# Patient Record
Sex: Female | Born: 1945 | Race: White | Hispanic: No | Marital: Single | Smoking: Former smoker
Health system: Southern US, Community
[De-identification: ages and names within clinical notes are randomized; demographics above are authoritative.]

## PROBLEM LIST (undated history)

## (undated) DIAGNOSIS — M7030 Other bursitis of elbow, unspecified elbow: Secondary | ICD-10-CM

## (undated) DIAGNOSIS — K929 Disease of digestive system, unspecified: Secondary | ICD-10-CM

## (undated) DIAGNOSIS — J449 Chronic obstructive pulmonary disease, unspecified: Secondary | ICD-10-CM

## (undated) DIAGNOSIS — G7000 Myasthenia gravis without (acute) exacerbation: Secondary | ICD-10-CM

## (undated) DIAGNOSIS — I10 Essential (primary) hypertension: Secondary | ICD-10-CM

## (undated) DIAGNOSIS — J479 Bronchiectasis, uncomplicated: Secondary | ICD-10-CM

## (undated) DIAGNOSIS — C099 Malignant neoplasm of tonsil, unspecified: Secondary | ICD-10-CM

## (undated) DIAGNOSIS — E039 Hypothyroidism, unspecified: Secondary | ICD-10-CM

## (undated) HISTORY — PX: FOOT SURGERY: SHX648

---
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
## 2005-08-13 HISTORY — PX: RECTOCELE REPAIR: SHX761

## 2006-02-11 HISTORY — PX: ABDOMINAL HYSTERECTOMY: SHX81

## 2007-08-28 HISTORY — PX: GASTROSTOMY TUBE PLACEMENT: SHX655

## 2007-09-14 DIAGNOSIS — G7000 Myasthenia gravis without (acute) exacerbation: Secondary | ICD-10-CM

## 2007-09-14 HISTORY — DX: Myasthenia gravis without (acute) exacerbation: G70.00

## 2013-03-28 ENCOUNTER — Inpatient Hospital Stay: Payer: Self-pay | Admitting: Internal Medicine

## 2013-03-28 LAB — COMPREHENSIVE METABOLIC PANEL
BUN: 32 mg/dL — ABNORMAL HIGH (ref 7–18)
Calcium, Total: 10.2 mg/dL — ABNORMAL HIGH (ref 8.5–10.1)
EGFR (African American): 50 — ABNORMAL LOW
Osmolality: 272 (ref 275–301)
Potassium: 3.7 mmol/L (ref 3.5–5.1)
SGOT(AST): 69 U/L — ABNORMAL HIGH (ref 15–37)
SGPT (ALT): 68 U/L (ref 12–78)
Sodium: 132 mmol/L — ABNORMAL LOW (ref 136–145)
Total Protein: 8.3 g/dL — ABNORMAL HIGH (ref 6.4–8.2)

## 2013-03-28 LAB — LIPASE, BLOOD: Lipase: 140 U/L (ref 73–393)

## 2013-03-28 LAB — CBC
HCT: 47.5 % — ABNORMAL HIGH (ref 35.0–47.0)
MCHC: 34.7 g/dL (ref 32.0–36.0)
MCV: 96 fL (ref 80–100)
Platelet: 218 10*3/uL (ref 150–440)

## 2013-03-28 LAB — URINALYSIS, COMPLETE
Bacteria: NONE SEEN
Glucose,UR: NEGATIVE mg/dL (ref 0–75)
Nitrite: NEGATIVE
RBC,UR: NONE SEEN /HPF (ref 0–5)
Specific Gravity: 1.011 (ref 1.003–1.030)

## 2013-03-29 LAB — COMPREHENSIVE METABOLIC PANEL
Albumin: 3.2 g/dL — ABNORMAL LOW (ref 3.4–5.0)
Anion Gap: 7 (ref 7–16)
BUN: 24 mg/dL — ABNORMAL HIGH (ref 7–18)
Bilirubin,Total: 0.6 mg/dL (ref 0.2–1.0)
Calcium, Total: 8.6 mg/dL (ref 8.5–10.1)
Chloride: 104 mmol/L (ref 98–107)
Co2: 25 mmol/L (ref 21–32)
Creatinine: 0.9 mg/dL (ref 0.60–1.30)
EGFR (African American): >60
Potassium: 3.2 mmol/L — ABNORMAL LOW (ref 3.5–5.1)
SGOT(AST): 32 U/L (ref 15–37)
Sodium: 136 mmol/L (ref 136–145)

## 2013-03-29 LAB — CBC WITH DIFFERENTIAL/PLATELET
Basophil #: 0 10*3/uL (ref 0.0–0.1)
Eosinophil #: 0 10*3/uL (ref 0.0–0.7)
Eosinophil %: 0 %
HCT: 38.3 % (ref 35.0–47.0)
Lymphocyte #: 0.8 10*3/uL — ABNORMAL LOW (ref 1.0–3.6)
Lymphocyte %: 11.6 %
MCH: 33.1 pg (ref 26.0–34.0)
MCV: 95 fL (ref 80–100)
Monocyte %: 9.1 %
Neutrophil #: 5.4 10*3/uL (ref 1.4–6.5)
Neutrophil %: 78.8 %
Platelet: 198 10*3/uL (ref 150–440)
RDW: 13.1 % (ref 11.5–14.5)

## 2013-03-29 LAB — TSH: Thyroid Stimulating Horm: 1.85 u[IU]/mL

## 2013-03-30 LAB — CBC WITH DIFFERENTIAL/PLATELET
Basophil #: 0 10*3/uL (ref 0.0–0.1)
Basophil %: 0.2 %
Eosinophil %: 0 %
HGB: 12.9 g/dL (ref 12.0–16.0)
Lymphocyte #: 0.5 10*3/uL — ABNORMAL LOW (ref 1.0–3.6)
MCHC: 34 g/dL (ref 32.0–36.0)
MCV: 97 fL (ref 80–100)
Neutrophil #: 6.4 10*3/uL (ref 1.4–6.5)
Neutrophil %: 88.8 %
Platelet: 193 10*3/uL (ref 150–440)
RBC: 3.9 10*6/uL (ref 3.80–5.20)
WBC: 7.2 10*3/uL (ref 3.6–11.0)

## 2013-03-30 LAB — BASIC METABOLIC PANEL
Anion Gap: 7 (ref 7–16)
Calcium, Total: 8.6 mg/dL (ref 8.5–10.1)
Chloride: 111 mmol/L — ABNORMAL HIGH (ref 98–107)
Co2: 25 mmol/L (ref 21–32)
Creatinine: 0.78 mg/dL (ref 0.60–1.30)
EGFR (Non-African Amer.): >60
Glucose: 145 mg/dL — ABNORMAL HIGH (ref 65–99)
Osmolality: 289 (ref 275–301)
Potassium: 3.1 mmol/L — ABNORMAL LOW (ref 3.5–5.1)
Sodium: 143 mmol/L (ref 136–145)

## 2013-03-31 LAB — CBC WITH DIFFERENTIAL/PLATELET
Basophil #: 0 10*3/uL (ref 0.0–0.1)
Basophil %: 0.6 %
HCT: 36.5 % (ref 35.0–47.0)
HGB: 12.6 g/dL (ref 12.0–16.0)
Lymphocyte %: 10.7 %
MCH: 33.2 pg (ref 26.0–34.0)
MCHC: 34.5 g/dL (ref 32.0–36.0)
Monocyte #: 0.5 x10 3/mm (ref 0.2–0.9)
Monocyte %: 7.7 %
Platelet: 171 10*3/uL (ref 150–440)
RBC: 3.79 10*6/uL — ABNORMAL LOW (ref 3.80–5.20)
WBC: 6.1 10*3/uL (ref 3.6–11.0)

## 2013-03-31 LAB — BASIC METABOLIC PANEL
Co2: 29 mmol/L (ref 21–32)
Creatinine: 0.67 mg/dL (ref 0.60–1.30)
EGFR (African American): >60
EGFR (Non-African Amer.): >60
Glucose: 121 mg/dL — ABNORMAL HIGH (ref 65–99)
Osmolality: 288 (ref 275–301)
Sodium: 143 mmol/L (ref 136–145)

## 2013-04-01 LAB — COMPREHENSIVE METABOLIC PANEL
Albumin: 3 g/dL — ABNORMAL LOW (ref 3.4–5.0)
Anion Gap: 4 — ABNORMAL LOW (ref 7–16)
BUN: 24 mg/dL — ABNORMAL HIGH (ref 7–18)
Bilirubin,Total: 0.6 mg/dL (ref 0.2–1.0)
Chloride: 109 mmol/L — ABNORMAL HIGH (ref 98–107)
EGFR (African American): >60
Glucose: 100 mg/dL — ABNORMAL HIGH (ref 65–99)
Osmolality: 285 (ref 275–301)
Potassium: 4.1 mmol/L (ref 3.5–5.1)
SGOT(AST): 24 U/L (ref 15–37)
Sodium: 141 mmol/L (ref 136–145)
Total Protein: 5.7 g/dL — ABNORMAL LOW (ref 6.4–8.2)

## 2013-04-01 LAB — PHOSPHORUS: Phosphorus: 2.2 mg/dL — ABNORMAL LOW (ref 2.5–4.9)

## 2013-04-01 LAB — MAGNESIUM: Magnesium: 1.8 mg/dL

## 2013-04-02 LAB — BASIC METABOLIC PANEL
Anion Gap: 1 — ABNORMAL LOW (ref 7–16)
Calcium, Total: 8.3 mg/dL — ABNORMAL LOW (ref 8.5–10.1)
Chloride: 107 mmol/L (ref 98–107)
Co2: 30 mmol/L (ref 21–32)
Creatinine: 0.76 mg/dL (ref 0.60–1.30)
EGFR (African American): >60
EGFR (Non-African Amer.): >60
Glucose: 115 mg/dL — ABNORMAL HIGH (ref 65–99)
Osmolality: 281 (ref 275–301)
Potassium: 3.2 mmol/L — ABNORMAL LOW (ref 3.5–5.1)
Sodium: 138 mmol/L (ref 136–145)

## 2013-04-02 LAB — PATHOLOGY REPORT

## 2015-01-03 NOTE — Discharge Summary (Signed)
PATIENT NAME:  Maria Vincent, Maria Vincent MR#:  [id number] DATE OF BIRTH:  [birth year]  DATE OF ADMISSION:  03/28/2013 DATE OF DISCHARGE:  04/03/2013  ADMITTING PHYSICIAN: Dr. Laurin Coder.  DISCHARGING PHYSICIAN: Gladstone Lighter, M.D.   PRIMARY PHYSICIAN:  At [place]: Dr. Candace Cruise.  CONSULTATIONS IN THE HOSPITAL:  Gastrointestinal consultation with Dr. Verdie Shire.  DISCHARGE DIAGNOSES: 1.  Nausea and vomiting secondary to constipation in the setting of myasthenia gravis.  2.  Constipation.  3.  Myasthenia gravis.  4.  Hypokalemia  5.  Malignant hypertension.   DISCHARGE MEDICATIONS: 1.  Cozaar 25 mg per G-tube twice a day.  2.  Spironolactone 12.5 mg per G-tube once a day at 7:00 a.m.  3.  Mestinon 60 mg per 5 mL syrup - 5 mL per G-tube 4 times a day.  5.  Synthroid 75 mcg per G-tube once a day.  6.  Fluoride topical paste applied to the denture trays twice a day after brushing teeth.  7.  Calcium carbonate 800 mg per G-tube once a day.  8.  Vitamin D3 500 international units per G-tube once a day.  9.  Diphenhydramine 25 mg per G-tube once a day at bedtime.  10.  Benefiber 10 mL per G-tube 3 times a day.  11.  Echinacea- 1tablet- each 400 mg per G-tube twice a day.  12.  Probiotic oral capsule 1 capsule per G-tube twice a day.  13.  Reglan 5 mg per 5 mL - 5 mL 3 times a day before meals.  14.  MiraLAX powder 17 grams daily for constipation. Stopped taking it if having more than 2 to 3 bowel movements per day.  15.  Norvasc 5 mg p.o. once a day at 7:00 p.m.  16.  Zofran 4 mg per 5 mL solution - 5 mL every 6 hours as needed for nausea or vomiting.   DISCHARGE DIET: Regular.  The patient is on Isosource tube bolus feeds 3 times a day.   DISCHARGE ACTIVITY: As tolerated.   FOLLOWUP INSTRUCTIONS: 1.  PCP follow-up in 1 to 2 weeks.  2.  Gastrointestinal follow-up with Dr. Candace Cruise in 2 to 3 weeks.  3.  Resume home tube feeds.   LABS AND IMAGING STUDIES PRIOR TO  DISCHARGE:   1.  Upper GI endoscopy performed on 03/30/13 showing feeding tube intact without ulceration or irritation.  Normal esophagus, normal duodenum and ulcers stomach.   2.  Sodium 138, potassium 3.2, chloride 107, bicarbonate 30, BUN 24, creatinine 0.76, glucose 115 and calcium of 8.3.  3.  Abdominal x-ray showing unremarkable abdominal radiograph.  4.  WBC 6.1, hemoglobin 12.6, hematocrit 36.5, platelet count 171.  5.  CT of the head without contrast for intractable headaches showing no acute intracranial process. MRCP done: Calcifications in the pancreas or in the splenic artery. No pancreatic ductal dilatation. Mild distention present. No biliary distention. Gallbladder is nondistended. Biopsies from esophagogastroduodenoscopy showing chronic gastritis, negative for H. pylori. Abdominal ultrasound done on admission showing tiny stones in proximal pancreatic duct with mild dilatation of the pancreatic duct. No gallstones or biliary distention.   BRIEF HOSPITAL COURSE: [name] is a 69 year old Caucasian female with past medical history significant for myasthenia gravis resulting in significant dysphagia that she has a PEG tube, was brought in from home secondary to intractable nausea and vomiting and unable to tolerate tube feeds. She had an abdominal ultrasound done here in the hospital, which revealed pancreatic duct stones and  so was admitted.  1.  Intractable nausea and vomiting. The patient had intractable nausea and vomiting for the first three days into hospital course. She was seen by GI consultants for the same. She had an MRCP done which said they were not stones, but more of calcifications on the pancreas. Because of her myasthenia gravis, she was already on Mestinon. She was started on Reglan here in the hospital and started on a good bowel regimen with fiber and also MiraLAX powder which relieved her constipation and since then her nausea and vomiting, improved. She was started on  trickle continuous tube feeds and then changed over to bolus feeds at the same rate that she takes at home and she tolerated them well prior to discharge. She was advised to follow up with Dr. Candace Cruise as an outpatient for possible EUS for pancreatic calcifications.  2.  Hypertension. Blood pressure has been elevated mostly in the evenings while here in the hospital. She is already taking Cozaar and spironolactone at home and Norvasc has been added to be given in the evenings through the G-tube.  THE PATIENT IS ALLERGIC TO CLONIDINE PATCH, as she had a severe reaction during prior hospitalization and severe constipation related to that and that was advised to be included in her allergies.   3.  Myasthenia gravis. Severe dysphagia, nothing by mouth. She is on Mestinon. She pools secretions in her mouth, which could also cause her nausea so I advised to suction as needed.  4.  Her course has been otherwise uneventful in the hospital. The patient has ambulated well with physical therapy and will be discharged home without any further needs.  She was also followed by the dietitian while in the hospital.   DISCHARGE CONDITION: Stable.   DISCHARGE DISPOSITION: Home.   TIME SPENT ON DISCHARGE: 45 minutes.  ____________________________ Gladstone Lighter, MD rk:dp D: 04/03/2013 14:51:52 ET T: 04/03/2013 16:34:37 ET JOB#: [id number]  cc: Gladstone Lighter, MD, <Dictator> Lupita Dawn. Candace Cruise, MD Gladstone Lighter MD ELECTRONICALLY SIGNED 04/16/2013 15:20

## 2015-01-03 NOTE — H&P (Signed)
PATIENT NAME:  Maria Vincent, Maria Vincent MR#:  [id number] DATE OF BIRTH:  12/19/1945  DATE OF ADMISSION:  03/28/2013  REASON FOR ADMISSION: Intractable nausea and vomiting, headache, abdominal distention.   REFERRING PHYSICIAN: Sheryl L. Benjaman Lobe, MD  PRIMARY CARE PHYSICIAN: At [facility].   HISTORY OF PRESENT ILLNESS: This is a very nice 69 year old female diagnosed with myasthenia gravis in 2009, comes with a history of nausea and vomiting intractable, dehydration, feeling really weak. Not tolerating feedings through gastrostomy tube with frequent nausea and vomiting, for what she is admitted for evaluation of this. She has a primary care physician, Dr. Burns Spain, in [place], [place], who is her neurologist. She goes also to [facility]. The patient states that she was doing okay up until yesterday. At around 1:00 p.m. she started having some headache over the left eye, nausea and vomiting. She has vomited about 20 times. She is having a lot of dry heaving. The patient had her last bowel movement this a.m. and had very hard, very small lump of stool. She says that yesterday she was having very small hard stools as well. She has not had any significant exacerbations of her MS, since last in April where she had a plasma exchange. She states she has been getting really hot and cold with chills, and she also has some difficulty mobilizing secretions due to her bronchiectasis. The patient is evaluated in the ER. She is very dehydrated. She got several boluses of IV fluids. Her blood pressure has been elevated at 234/139, but now is starting to come down with Vasotec. The patient was tachycardic at 98, afebrile with temperature of 97.9. The patient at this moment is doing a little bit better, but still feels that she cannot eat. She feels very, very nauseous.   REVIEW OF SYSTEMS: CONSTITUTIONAL: The patient denies any fever, but she feels hot and cold occasionally within the last 24 hours. She is very  fatigued. She is weak. She denies any significant weight loss or weight gain. HEENT: Eyes: No blurry vision, double vision. No epistaxis or nasal discharge.  RESPIRATORY: No cough, wheezing, hemoptysis. The patient has COPD and bronchiectasis  and has always a lot of secretions and at this moment, they do not seem any different.  CARDIOVASCULAR: No chest pain, orthopnea, syncope or palpitations.  GASTROINTESTINAL: Positive nausea. Positive vomiting. No diarrhea. Positive mild abdominal pain due to the dry heaving and vomiting, just muscle pain. No hematemesis. No jaundice. No rectal bleeding.  GENITOURINARY: No dysuria, hematuria, changes in frequency.  GYNECOLOGIC: No breast masses.  ENDOCRINE: No polyuria, polydipsia, polyphagia, cold or heat intolerance.  HEMATOLOGIC AND LYMPHATIC: No anemia, easy bruising or swollen glands.  SKIN: No rashes, petechiae or new moles.  MUSCULOSKELETAL: No significant back pain, shoulder pain, neck pain or gout. The patient has myasthenia gravis and she is weak all over, and is regular basis, but not incapacitated.  NEUROLOGIC: Myasthenia gravis. Positive headache. Right now, the headache is 5 to 7 out of 10 on the left eye. She states it is similar to prior headaches that she had before whenever she has been dehydrated.  PSYCHIATRIC: No significant depression. No significant insomnia.   PAST MEDICAL HISTORY: 1.  Myasthenia gravis. Her neurologist for myasthenia gravis is Dr. Irven Easterly. [place].  2.  Hypertension.  3.  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  4.  Bronchiectasis.  5.  Dysphagia.  6.  Chronic aspiration.  7.  COPD.  8.  Mild pulmonary fibrosis.  9.  Hypertension, followed by Dr. Burns Spain, primary care physician at [facility].  10.  Hypothyroidism.  PAST SURGICAL HISTORY: 1.  G-tube placement on 08/28/2007 with replacement on 03/02/2013.  2.  Rectocele repair.  3.  Foot surgery due to bone spurs.  4.   Hysterectomy in 1997.  5.  Appendectomy in 1962.   SOCIAL HISTORY: She used to smoke for 19 years 1 pack a day. She quit in 1987. She does not drink. She lives by herself. Recently moved back from [place] to be close to her family. She is a retired Optometrist.   FAMILY HISTORY: Positive for MI in her mom, dad, grandparents. CVA in her grandfather  mother. Breast cancer in aunt.   ALLERGIES: THE PATIENT IS ALLERGIC TO PENICILLIN, NOVOCAIN, , NEOSPORIN, POLYSPORIN, BACITRACIN, CEPHALOSPORINS, BIAXIN, AMBIEN, DEMEROL, PHENERGAN, ALBUTEROL. SHE IS ALSO ALLERGIC TO JUNIPER AND INSECT STINGS.  CURRENT MEDICATIONS: Include:  1.  Cozaar 25 mg twice daily. 2.  Spironolactone 12.5 mg in the morning. 3.  Mestinon 1 teaspoon at 7:00 a.m., 10:00 a.m., 1:00 p.m., 4:00 p.m. 4.  Synthroid 75 mcg once daily. 5.  Isosorbide 1.5 boluses through the day.  6.  Bactroban as needed.  7.  Calcium, vitamin D once a day.   PHYSICAL EXAMINATION: VITAL SIGNS: Blood pressure 234/139, pulse of 98, respirations 18, temperature 97.7, oxygen saturation of 97% on room air.  GENERAL: The patient is alert, oriented x 3. Looks severely debilitated, dehydrated. No use of accessory muscles.  HEENT: Pupils are equal and reactive. Extraocular movements are intact. Mucosae are dry. Anicteric sclerae. Pink conjunctivae. No oral lesions. Normocephalic, atraumatic. No thrush.  NECK: Supple. No JVD. No thyromegaly. No adenopathy. No carotid bruits. Trachea central. No rigidity.  CARDIOVASCULAR: Regular rate and rhythm, tachycardic. No murmurs, rubs or gallops are appreciated. PMI is nondisplaced. No tenderness to palpation anterior chest wall.  LUNGS: Clear without any wheezing or crepitus. No use of accessory muscles. No rhonchi.  ABDOMEN: Soft, slightly distended, mildly tender to palpation at the level of the suprapubic area and left lower quadrant. No Murphy. No McBurney. No rebound. Abdomen is slightly distended and tympanic.   GENITAL: Negative for external lesions.  EXTREMITIES: No edema, cyanosis or clubbing. Pulses +2. Capillary refill less than 3.  LYMPHATIC: Negative for lymphadenopathy in neck or supraclavicular areas.  SKIN: No rashes or petechiae. Positive decreased turgor.  VASCULAR: Pulses +2. Capillary refill less than 3.  NEUROLOGIC: Cranial nerves II through XII intact. No focal findings. Strength is equal in 4 extremities. The patient moves all 4 extremities. Her strength is actually 5/5.  PSYCHIATRIC: No significant alteration of judgment. The patient is alert, oriented x 3. No significant depression.  MUSCULOSKELETAL: No significant joint effusions or joint erythema.   LABORATORY AND RADIOLOGICAL DATA: Glucose 104, BUN 32; potassium is 3.7; serum sodium is 132, GFR around 43; total calcium is 10.2, AST 69, alkaline phosphatase 128, bilirubin 0.9. Hemoglobin 16.5; white count is 9.4, platelet count 218. Urinalysis: No signs of urinary tract infection. Chest x-ray: No infiltrates. Ultrasound abdomen: As mentioned above, tiny stones appear to be present in proximal pancreatic duct with mild dilation of the pancreatic duct. No gallstones or biliary distention is noticed.   ASSESSMENT AND PLAN: A 69 year old female with history of myasthenia gravis comes with intractable nausea and vomiting.  1.  Intractable nausea and vomiting: This is likely secondary to severe constipation. The patient had a KUB. The KUB shows a  large amount of stool in the proximal colon and distal colon at the level of the sigmoid. The patient is likely to have severe constipation causing her to have signs of pseudo-obstruction. There are no significant fluid levels at the level of the intestine, but she is definitely constipated and she is very small. We are going to start from holding her tube feedings and giving her nausea medication. Treating her constipation first with an enema, then adding on MiraLAX. We are going to go from that and  see if that improves. Another cause of the nausea could be related to stones at the level of the pancreatic tube. I do not know how significant is this. We are going to add a gastroenterology consultation in the morning to see if there is anything else that needs to be done. The pancreatic stones could be related to dehydration, but it also could be that patient passed a stone from the gallbladder and that made her have significant nausea and vomiting. The patient does not have any significant elevation of lipase. Will monitor dehydration. If the patient continues to have significant nausea and vomiting, we are going to place a nasogastric tube.  2.  Accelerated hypertension: The patient has significant uncontrolled hypertension in general. She was on a clonidine patch and that did not touch her blood pressure today. Her blood pressure is more elevated. We are going to do hydralazine. We are going to do Vasotec. Monitor closely. Avoid beta blockers. Due to patient's myasthenia gravis, beta blockers could be contraindicated. 3.  Bronchiectasis: The patient does not seem to have any exacerbation, for what we are just going to monitor.  4.  Chronic obstructive pulmonary disease: Add on nebulizers as needed. 5.  Gastrointestinal prophylaxis with Protonix.  6.  Deep vein thrombosis prophylaxis with heparin.   TIME SPENT: I spent about 45 minutes with this patient.    ____________________________ [place] Sink, MD rsg:jm D: 03/28/2013 19:37:33 ET T: 03/28/2013 20:19:32 ET JOB#: [id number]  cc:  Sink, MD, <Dictator> Eldridge Marcott America Brown MD ELECTRONICALLY SIGNED 03/29/2013 20:29

## 2015-01-03 NOTE — Consult Note (Signed)
Pt seen and examined. Please see [facility] Sos notes. Acute onset of nausea and vomiting. Still nauseous after a good bowel movement. Possible pancreatic stone seen on CT. Normal LFT and lipase.  Recommend MRCP. IF MRCP clearly shows PD stone, recommend referring to [facility] or elsewhere for stone removal either now or later. If MRCP neg, then can proceed with EGD to r/o other causes if nausea persists. Thanks.   Electronic Signatures: Verdie Shire (MD) (Signed on 17-Jul-14 13:36)  Authored   Last Updated: 17-Jul-14 13:37 by Verdie Shire (MD)

## 2015-01-03 NOTE — Consult Note (Signed)
PATIENT NAME:  Maria Vincent, Maria Vincent MR#:  [id number] DATE OF BIRTH:  [birth year]  DATE OF CONSULTATION:  03/29/2013  REFERRING PHYSICIAN:  [place] Sink, MD CONSULTING PHYSICIAN:  Corky Sox. Zettie Pho, PA-C  DICTATING FOR: Lupita Dawn. Oh, MD  REASON FOR CONSULTATION: Nausea, vomiting and possible obstruction.   HISTORY OF PRESENT ILLNESS: This is a pleasant 69 year old female with a past medical history significant for myasthenia gravis and tonsillar cancer that has resulted in chronic aspiration, requiring her to get her feedings through a G-tube. PEG was placed several years ago, but was recently exchanged for a fresh tube by Dr. Baird Cancer at [facility] in June of last month. She states that over the past few days, she has had intractable nausea and vomiting, where each time she administers herself a feed, she vomits it right back up. Upon further workup, the patient did undergo a KUB that did demonstrate bowel gas pattern that was nonspecific; however, there was some extensive constipation noted. She also underwent an abdominal ultrasound that was questionable for a dilated pancreatic duct at 1.5 mm, with possible stones in the proximal duct. Of note, she had no gallstones or biliary distention. Her LFTs and her lipase are within normal limits. Because of the constipation findings, she was administered an enema yesterday evening, and a large bowel movement was able to be produced. She does admit that she felt somewhat better following this, but is still complaining of dry heaves and nausea. There has been no actual emesis; however, she also has not been eating, and she has been n.p.o. A urinalysis is negative, and white count is within normal limits as well.   ALLERGIES: PENICILLIN, NOVOCAIN, NEOSPORIN, POLYSPORIN, CEPHALOSPORINS, BACITRACIN, BIAXIN, AMBIEN, DEMEROL, PHENERGAN, ALBUTEROL, JUNIPER, BEE STINGS.   HOME MEDICATIONS: Calcium, Bactroban, isosorbide, Synthroid, Mestinon, spironolactone and Cozaar.    PAST MEDICAL HISTORY: Tonsillar cancer status post radiation, myasthenia gravis, hypertension, bronchiectasis, chronic aspiration, COPD, pulmonary fibrosis, hypertension and hypothyroidism.   PAST SURGICAL HISTORY: G-tube placement in 2008, this was replaced in 2014, appendectomy, hysterectomy, foot surgery and rectocele repair.   FAMILY HISTORY: No known family history of GI malignancy, colon polyps or IBD.   SOCIAL HISTORY: Remote history of tobacco use with a 19-pack-year history, but she quit over 20 years ago. She denies any current alcohol, tobacco or illicit drug use.   REVIEW OF SYSTEMS: A 10-system review of systems was obtained on the patient. Pertinent positives are mentioned above and otherwise negative.   OBJECTIVE:  VITAL SIGNS: Blood pressure 121/76, heart rate 99, respirations 20, temperature 98.4, bedside pulse oximetry 95%.  GENERAL: This is a 69 year old female resting quietly and comfortably in bed, in mild distress.  HEAD: Atraumatic, normocephalic.  NECK: Supple. No lymphadenopathy noted.  HEENT: Sclerae anicteric. Mucous membranes moist.  PULMONARY: Respirations are even and unlabored. Clear to auscultation of bilateral anterior lung fields.  CARDIAC: Regular rate and rhythm. S1, S2 noted.  ABDOMEN: Soft, nontender, nondistended. PEG tube noted in place. Normoactive bowel sounds noted in all 4 quadrants.  RECTAL: Deferred.  PSYCHIATRIC: Appropriate mood and affect.  EXTREMITIES: Negative for lower extremity edema, 2+ pulses noted bilaterally.   LABORATORY DATA: White blood cells 6.9, hemoglobin 13.3, hematocrit 38.3, platelets 198. Sodium 136, potassium 3.2, BUN 24, creatinine 0.90, glucose 118. Urinalysis is negative. Lipase 140. LFTs are within normal limits with a bilirubin of 0.6, AST 32, ALT 46, alkaline phosphatase 97, albumin slightly low at 3.2. TSH 1.85.   IMAGING: A KUB was obtained on the  patient showing bowel gas pattern that was nonspecific, with no  evidence of obstruction. Extensive constipation, however, was noted.   Chest x-ray was obtained on the patient showing hyperinflation consistent with COPD, but no focal pneumonia and no evidence of CHF or pleural effusion.   Ultrasound of the abdomen was obtained on the patient showing pancreatic duct dilated at 1.5 mm with possible stones in the proximal duct. No pancreatic mass was identified. Gallbladder wall thickness was normal. No evidence of gallstones within the gallbladder. Liver appeared unremarkable.   ASSESSMENT:  1. Intractable nausea and vomiting.  2. History of dysphagia, currently with a PEG tube in place by Dr. Baird Cancer at [facility].  3. Constipation noted on x-ray.  4. Abnormal ultrasound noting questionable dilation of her pancreatic duct with stones in the distal aspect of the duct.  5. History of myasthenia gravis.  6. Chronic obstructive pulmonary disease.   PLAN: I have discussed this patient's case in detail with Dr. Verdie Shire, who was involved in the development of the patient's plan of care. At this time, we did have a long discussion with the patient regarding potential etiologies of her intractable nausea and vomiting. It could certainly be related to the extensive constipation noted on x-ray. Therefore, we do agree with administration of enemas and continued bowel care with MiraLax and stool softener if necessary. In regard to the ultrasound, however, there was questionable pancreatic ductal dilation with stones within the duct, and therefore we do recommend proceeding with an MRCP to evaluate if this is truly a stone or obstruction or not. Of note, her LFTs and lipase are within normal limits. Should this be a stone, this could certainly be contributing to her nausea and vomiting as well. We do recommend continuing symptomatic management with fluids and antiemetics as needed, and we will make further recommendations pending the MRCP, bowel care and per clinical course. This was  discussed with the patient, and she verbalized understanding, and all questions were answered.   Thank you so much for this consultation and for allowing [place] to participate in the patient's plan of care.   ATTENDING GASTROENTEROLOGIST: Lupita Dawn. Candace Cruise, MD  ____________________________ Corky Sox. Raseel Jans, PA-C kme:OSi D: 03/29/2013 13:06:44 ET T: 03/29/2013 13:29:53 ET JOB#: [id number]  cc: Corky Sox. Shamyah Stantz, PA-C, <Dictator> [place] PA ELECTRONICALLY SIGNED 04/02/2013 13:41

## 2015-01-03 NOTE — Consult Note (Signed)
Chief Complaint:  Subjective/Chief Complaint Events of the weekend noted. Feeling much better. No nausea. Having BM's. So far tolerating TF's.   VITAL SIGNS/ANCILLARY NOTES: **Vital Signs.:   21-Jul-14 08:00  Vital Signs Type Q 4hr  Temperature Temperature (F) 98.9  Celsius 37.1  Temperature Source oral  Pulse Pulse 89  Respirations Respirations 18  Systolic BP Systolic BP 219  Diastolic BP (mmHg) Diastolic BP (mmHg) 93  Mean BP 109  Pulse Ox % Pulse Ox % 95  Pulse Ox Activity Level  At rest  Oxygen Delivery Room Air/ 21 %   Brief Assessment:  GEN no acute distress   Cardiac Regular   Respiratory clear BS   Gastrointestinal Normal   Lab Results: Routine Chem:  21-Jul-14 05:53   Glucose, Serum  115  BUN  24  Creatinine (comp) 0.76  Sodium, Serum 138  Potassium, Serum  3.2  Chloride, Serum 107  CO2, Serum 30  Calcium (Total), Serum  8.3  Anion Gap  1  Osmolality (calc) 281  eGFR (African American) >60  eGFR (Non-African American) >60 (eGFR values <78m/min/1.73 m2 may be an indication of chronic kidney disease (CKD). Calculated eGFR is useful in patients with stable renal function. The eGFR calculation will not be reliable in acutely ill patients when serum creatinine is changing rapidly. It is not useful in  patients on dialysis. The eGFR calculation may not be applicable to patients at the low and high extremes of body sizes, pregnant women, and vegetarians.)   Assessment/Plan:  Assessment/Plan:  Assessment Nausea. Better.   Plan Continue reglan if helping via G tube. Miralax daily for regular BM's. Discussed possibility of EUS later to check for chronic pancreatits if recurrent symptoms. Will sign off. Pt can f/u with [place] discharge. Thanks.   Electronic Signatures: OVerdie Shire(MD)  (Signed 21-Jul-14 10:54)  Authored: Chief Complaint, VITAL SIGNS/ANCILLARY NOTES, Brief Assessment, Lab Results, Assessment/Plan   Last Updated: 21-Jul-14 10:54 by OVerdie Shire(MD)

## 2015-01-03 NOTE — Consult Note (Signed)
EGD done. Feeding tube intact. No explanation for nausea. Gastric bx taken for H.pylori. Would proceed with CT or MRI to evaluate pancreas further. Dr. Gustavo Lah will see patient over the weekend. Thanks.  Electronic Signatures: Verdie Shire (MD)  (Signed on 18-Jul-14 08:43)  Authored  Last Updated: 18-Jul-14 08:43 by Verdie Shire (MD)

## 2015-01-03 NOTE — Consult Note (Signed)
Chief Complaint:  Subjective/Chief Complaint MRI put on hold until radiology obtains info from [facility] regarding possible metal content in PEG tube. Pt with headache. Still feels nauseous though less so. No BM yest.   VITAL SIGNS/ANCILLARY NOTES: **Vital Signs.:   18-Jul-14 05:03  Vital Signs Type Recheck  Systolic BP Systolic BP 520  Diastolic BP (mmHg) Diastolic BP (mmHg) 82  Mean BP 106  BP Source  if not from Vital Sign Device manual   Brief Assessment:  GEN no acute distress   Cardiac Regular   Respiratory clear BS   Gastrointestinal Button in stomach. O/W normal.   Lab Results:  Routine Chem:  18-Jul-14 05:36   Glucose, Serum  145  BUN 18  Creatinine (comp) 0.78  Sodium, Serum 143  Potassium, Serum  3.1  Chloride, Serum  111  CO2, Serum 25  Calcium (Total), Serum 8.6  Anion Gap 7  Osmolality (calc) 289  eGFR (African American) >60  eGFR (Non-African American) >60 (eGFR values <42m/min/1.73 m2 may be an indication of chronic kidney disease (CKD). Calculated eGFR is useful in patients with stable renal function. The eGFR calculation will not be reliable in acutely ill patients when serum creatinine is changing rapidly. It is not useful in  patients on dialysis. The eGFR calculation may not be applicable to patients at the low and high extremes of body sizes, pregnant women, and vegetarians.)  Routine Hem:  18-Jul-14 05:36   WBC (CBC) 7.2  RBC (CBC) 3.90  Hemoglobin (CBC) 12.9  Hematocrit (CBC) 37.9  Platelet Count (CBC) 193  MCV 97  MCH 33.0  MCHC 34.0  RDW 13.3  Neutrophil % 88.8  Lymphocyte % 6.6  Monocyte % 4.4  Eosinophil % 0.0  Basophil % 0.2  Neutrophil # 6.4  Lymphocyte #  0.5  Monocyte # 0.3  Eosinophil # 0.0  Basophil # 0.0 (Result(s) reported on 30 Mar 2013 at 06:40AM.)   Radiology Results: [place]    16-Jul-14 16:35, [place]dom[place] Survey  [place] Survey   REASON FOR EXAM:    RUQ pain  COMMENTS:   Body Site: GB and Fossa,  CBD, Head of Pancreas    PROCEDURE: [place] - [place]DOM[place] LIMITED SURVEY  - Mar 28 2013  4:35PM     RESULT: History: Pain.    Comparison Study: No prior.    Findings: Liver normal. Portal vein patent. Gallbladder normal. Negative   Murphy's sign. Gallbladder wall thickness 3.8 mm. Common bile duct   caliber 1.5 mm.    Pancreatic duct is dilated at 1.5 mm with possible stones in the proximal   duct. No pancreatic mass is identified.  IMPRESSION:  Tiny stones appear to be present in the proximal pancreatic   duct with mild dilatation of the a pancreatic duct. No gallstones or   biliary distention noted.        Verified By: TOsa Craver M.D., MD   Assessment/Plan:  Assessment/Plan:  Assessment Persistent nausea. Poss PD stone.   Plan While we await MR, will proceed with EGD this morning to check for any ulcer disease that can account for nausea. If MRCP cannot be done, then order CT of abd with contrast to evaluate pancreatic duct.Thanks.   Electronic Signatures: OVerdie Shire(MD)  (Signed 18-Jul-14 07:17)  Authored: Chief Complaint, VITAL SIGNS/ANCILLARY NOTES, Brief Assessment, Lab Results, Radiology Results, Assessment/Plan   Last Updated: 18-Jul-14 07:17 by OVerdie Shire(MD)

## 2015-01-03 NOTE — Consult Note (Signed)
Chief Complaint:  Subjective/Chief Complaint patient seen for n/v.  currently no n/v, discussed with Dr Tressia Miners this am, will trial low volume TF.   VITAL SIGNS/ANCILLARY NOTES: **Vital Signs.:   19-Jul-14 14:36  Vital Signs Type Routine  Temperature Temperature (F) 98.6  Celsius 37  Pulse Pulse 112  Respirations Respirations 18  Systolic BP Systolic BP 431  Diastolic BP (mmHg) Diastolic BP (mmHg) 540  Mean BP 134  Pulse Ox % Pulse Ox % 98  Pulse Ox Activity Level  At rest  Oxygen Delivery Room Air/ 21 %  *Intake and Output.:   19-Jul-14 14:30  Tube Feeding Residual (ml) ml 0    17:00  Tube Feeding Residual (ml) ml 0   Brief Assessment:  Cardiac Regular   Respiratory clear BS   Gastrointestinal details normal Soft  Nontender  Nondistended  No masses palpable  Bowel sounds normal   Lab Results:  Routine Chem:  19-Jul-14 06:04   Result Comment POTASSIUM - RESULTS VERIFIED BY REPEAT TESTING.  - NOTIFIED OF CRITICAL VALUE  - C/SYLVIA FUENTES AT 0867 03/31/13-DAC  - READ-BACK PROCESS PERFORMED.  Result(s) reported on 31 Mar 2013 at 06:35AM.  Potassium, Serum  2.4  Glucose, Serum  121  BUN 18  Creatinine (comp) 0.67  Sodium, Serum 143  Chloride, Serum  108  CO2, Serum 29  Calcium (Total), Serum 8.5  Anion Gap  6  Osmolality (calc) 288  eGFR (African American) >60  eGFR (Non-African American) >60 (eGFR values <46m/min/1.73 m2 may be an indication of chronic kidney disease (CKD). Calculated eGFR is useful in patients with stable renal function. The eGFR calculation will not be reliable in acutely ill patients when serum creatinine is changing rapidly. It is not useful in  patients on dialysis. The eGFR calculation may not be applicable to patients at the low and high extremes of body sizes, pregnant women, and vegetarians.)    16:07   Result Comment POTASSIUM - RESULTS VERIFIED BY REPEAT TESTING.  - NOTIFIED OF CRITICAL VALUE  - C/COBY LEIPER AT 1632  03/31/13-LAB  - READ-BACK PROCESS PERFORMED.  Result(s) reported on 31 Mar 2013 at 04:33PM.  Potassium, Serum  2.5  Routine Hem:  19-Jul-14 06:04   WBC (CBC) 6.1  RBC (CBC)  3.79  Hemoglobin (CBC) 12.6  Hematocrit (CBC) 36.5  Platelet Count (CBC) 171  MCV 96  MCH 33.2  MCHC 34.5  RDW 13.1  Neutrophil % 81.0  Lymphocyte % 10.7  Monocyte % 7.7  Eosinophil % 0.0  Basophil % 0.6  Neutrophil # 4.9  Lymphocyte #  0.6  Monocyte # 0.5  Eosinophil # 0.0  Basophil # 0.0 (Result(s) reported on 31 Mar 2013 at 0[facility])   Radiology Results: MRI:    18-Jul-14 14:05, MRCP MR Cholangiogram  MRCP MR Cholangiogram   REASON FOR EXAM:    Possible pancreatic duct stone on Ultrasound  COMMENTS:       PROCEDURE: MR  - MR CHOLANGIOGRAM  - Mar 30 2013  2:05PM     RESULT: History: Pancreatic duct stone. Comparison Study: Ultrasound   abdomen 03/28/2013.    Technique:M.R.C.P. obtained. Axial and reconstructed images reviewed.    Findings: Gallbladder is nondistended. No biliary ductal distention   noted. Calcification in the pancreas could represent splenic artery   calcification or ductal stones. Mild pancreatic ductal distention noted.   If further evaluation of the pancreatic duct is needed, standard ERCP   will be needed as the calcifications in the region of  the a pancreas     could be in the splenic artery or the pancreatic duct .    IMPRESSION:  Cannot be determined if the calcifications in the pancreas   are in the splenic artery or pancreatic duct. No prominent pancreatic   ductal distention is noted. Mild distention is present. No biliary   distention. Gallbladder is nondistended.        Verified TI:RWERXV ERodrigo Ran, M.D., MD  CT:    18-Jul-14 14:12, CT Head Without Contrast  CT Head Without Contrast   REASON FOR EXAM:    intractable heache with nausea  COMMENTS:       PROCEDURE: CT  - CT HEAD WITHOUT CONTRAST  - Mar 30 2013  2:12PM     RESULT: Comparison:   None    Technique: Multiple axial images from the foramen magnum to the vertex   were obtained without IV contrast.    Findings:      There is no evidence of mass effect, midline shift, or extra-axial fluid   collections.  There is no evidence of a space-occupying lesion or   intracranial hemorrhage. There is no evidence of a cortical-based area of     acute infarction.      The ventricles and sulci are appropriate for the patient's age. The basal   cisterns are patent.    Visualized portions of the orbits are unremarkable. There is a small left   maxillary sinus air-fluid level.     Theosseous structures are unremarkable.    IMPRESSION:      No acute intracranial process.    Left maxillary sinusitis.  Dictation Site: 1        Verified By: Jennette Banker, M.D., MD   Assessment/Plan:  Assessment/Plan:  Assessment 1) nausea, felt to be possible related to constipation/gastric stasis in this patient with myasthenia gravis.  MRCP yesterday not confirmatory for pancreatic duct stones, note normal lipase and no dilation of the P-duct on MRCP.  Recommend daily rather than prn miralax, will order 2 way abd film for tomorrow am. 2) electrolyte abnormalities noted, woudl correct to improve GI motility.   Plan as above, following.   Electronic Signatures: Loistine Simas (MD)  (Signed 19-Jul-14 20:01)  Authored: Chief Complaint, VITAL SIGNS/ANCILLARY NOTES, Brief Assessment, Lab Results, Radiology Results, Assessment/Plan   Last Updated: 19-Jul-14 20:01 by Loistine Simas (MD)

## 2015-01-03 NOTE — Consult Note (Signed)
Brief Consult Note: Diagnosis: nausea, vomiting.   Patient was seen by consultant.   Consult note dictated.   Orders entered.   Discussed with Attending MD.   Comments: Patient seen and evaluated. Intractable nausea and vomiting. KUB normal bowel gas pattern but extensive constipation noted. She was given an enema last night which produced a large quantity BM. She felt a little better but still uncomfortable. No further emesis but she has been NPO. She receives nutrition via G-tube due to history of aspiration. Tube recently replaced in June by Dr Baird [facility] at [facility] Patient also had an [place] noting a possible pancreatici duct stone. LFTs and Lipase are WNL. Recommend MRCP to evaluate the biliary tree and see if this is truly a PD stone. Also agree with bowel care to relieve her constipation. N/V may certainly be a result of either of those. CTM LFTs. antiemetics PRN. Full consult dictated. Will follow.  Electronic Signatures: Sherald Barge (PA-C)  (Signed 17-Jul-14 12:43)  Authored: Brief Consult Note   Last Updated: 17-Jul-14 12:43 by Sherald Barge (PA-C)

## 2015-01-03 NOTE — Consult Note (Signed)
Chief Complaint:  Subjective/Chief Complaint seen for nausea, emesis.  doing well, TF restarted at low rate, tolerating well.  denies n/v or abdominal pain.  had a bm today.   VITAL SIGNS/ANCILLARY NOTES: **Vital Signs.:   20-Jul-14 05:21  Vital Signs Type Recheck  Pulse Pulse 91  Systolic BP Systolic BP 193  Diastolic BP (mmHg) Diastolic BP (mmHg) 86  Mean BP 101    14:33  Vital Signs Type Q 4hr  Temperature Temperature (F) 97.9  Celsius 36.6  Temperature Source oral  Pulse Pulse 106  Respirations Respirations 18  Systolic BP Systolic BP 790  Diastolic BP (mmHg) Diastolic BP (mmHg) 95  Mean BP 123  Pulse Ox % Pulse Ox % 96  Pulse Ox Activity Level  At rest  Oxygen Delivery Room Air/ 21 %  *Intake and Output.:   20-Jul-14 10:45  Stool  pt. had diahrea    15:35  Stool  pt. had a small amount of diahrea   Brief Assessment:  Cardiac Regular   Respiratory clear BS   Gastrointestinal details normal Soft  Nontender  Nondistended  No masses palpable  Bowel sounds normal   Lab Results: Hepatic:  20-Jul-14 06:16   Bilirubin, Total 0.6  Alkaline Phosphatase 87  SGPT (ALT) 33  SGOT (AST) 24  Total Protein, Serum  5.7  Albumin, Serum  3.0  Routine Chem:  20-Jul-14 06:16   Magnesium, Serum 1.8 (1.8-2.4 THERAPEUTIC RANGE: 4-7 mg/dL TOXIC: > 10 mg/dL  -----------------------)  Phosphorus, Serum  2.2 (Result(s) reported on 01 Apr 2013 at 06:57AM.)  Glucose, Serum  100  BUN  24  Creatinine (comp) 0.76  Sodium, Serum 141  Potassium, Serum 4.1  Chloride, Serum  109  CO2, Serum 28  Calcium (Total), Serum 8.6  Osmolality (calc) 285  eGFR (African American) >60  eGFR (Non-African American) >60 (eGFR values <40m/min/1.73 m2 may be an indication of chronic kidney disease (CKD). Calculated eGFR is useful in patients with stable renal function. The eGFR calculation will not be reliable in acutely ill patients when serum creatinine is changing rapidly. It is not useful in   patients on dialysis. The eGFR calculation may not be applicable to patients at the low and high extremes of body sizes, pregnant women, and vegetarians.)  Anion Gap  4   Radiology Results: XRay:    20-Jul-14 09:07, Abdomen Flat and Erect  Abdomen Flat and Erect   REASON FOR EXAM:    nausea vomiting, constipation  COMMENTS:       PROCEDURE: DXR - DXR ABDOMEN 2 V FLAT AND ERECT  - Apr 01 2013  9:07AM     RESULT: Comparisons:  03/28/2013    Findings:      Supine and upright views of the abdomen are provided.    There is a nonspecific bowel gas pattern. There is no bowel dilatation to   suggest obstruction. There are no air-fluid levels. There is no   pathologic calcification along the expected course of the ureters. There   is no evidence of pneumoperitoneum,portal venous gas, or pneumatosis.  The osseous structures are unremarkable.    IMPRESSION:     Unremarkable abdominal radiograph.    Dictation Site: 1        Verified By: HJennette Banker M.D., MD   Assessment/Plan:  Assessment/Plan:  Assessment 1) nausea, vomiting in the settign of probable gastroparesis and constipation, history of myasthenia gravis.  improved symptomatically.   Plan 1) change iv reglan to 5 mg SYRUP per  ngt q 8 hours, may decrease miralax to prn. Dr Candace Cruise to be back tomorrow.   Electronic Signatures: Loistine Simas (MD)  (Signed 20-Jul-14 18:29)  Authored: Chief Complaint, VITAL SIGNS/ANCILLARY NOTES, Brief Assessment, Lab Results, Radiology Results, Assessment/Plan   Last Updated: 20-Jul-14 18:29 by Loistine Simas (MD)

## 2015-10-10 ENCOUNTER — Encounter (HOSPITAL_BASED_OUTPATIENT_CLINIC_OR_DEPARTMENT_OTHER): Payer: Self-pay | Admitting: *Deleted

## 2015-10-10 ENCOUNTER — Inpatient Hospital Stay (HOSPITAL_BASED_OUTPATIENT_CLINIC_OR_DEPARTMENT_OTHER)
Admission: EM | Admit: 2015-10-10 | Discharge: 2015-10-15 | DRG: 177 | Disposition: A | Discharge status: Home / Self Care | Payer: Medicare Other | Attending: Internal Medicine | Admitting: Internal Medicine

## 2015-10-10 ENCOUNTER — Emergency Department (HOSPITAL_BASED_OUTPATIENT_CLINIC_OR_DEPARTMENT_OTHER): Payer: Medicare Other

## 2015-10-10 DIAGNOSIS — E871 Hypo-osmolality and hyponatremia: Secondary | ICD-10-CM | POA: Diagnosis present

## 2015-10-10 DIAGNOSIS — R112 Nausea with vomiting, unspecified: Secondary | ICD-10-CM | POA: Diagnosis not present

## 2015-10-10 DIAGNOSIS — E034 Atrophy of thyroid (acquired): Secondary | ICD-10-CM

## 2015-10-10 DIAGNOSIS — Z9981 Dependence on supplemental oxygen: Secondary | ICD-10-CM

## 2015-10-10 DIAGNOSIS — Z884 Allergy status to anesthetic agent status: Secondary | ICD-10-CM

## 2015-10-10 DIAGNOSIS — Z885 Allergy status to narcotic agent status: Secondary | ICD-10-CM

## 2015-10-10 DIAGNOSIS — C099 Malignant neoplasm of tonsil, unspecified: Secondary | ICD-10-CM | POA: Diagnosis present

## 2015-10-10 DIAGNOSIS — R0902 Hypoxemia: Secondary | ICD-10-CM

## 2015-10-10 DIAGNOSIS — K219 Gastro-esophageal reflux disease without esophagitis: Secondary | ICD-10-CM | POA: Diagnosis present

## 2015-10-10 DIAGNOSIS — R6889 Other general symptoms and signs: Secondary | ICD-10-CM

## 2015-10-10 DIAGNOSIS — Z823 Family history of stroke: Secondary | ICD-10-CM

## 2015-10-10 DIAGNOSIS — Z6821 Body mass index (BMI) 21.0-21.9, adult: Secondary | ICD-10-CM

## 2015-10-10 DIAGNOSIS — G7000 Myasthenia gravis without (acute) exacerbation: Secondary | ICD-10-CM | POA: Diagnosis present

## 2015-10-10 DIAGNOSIS — E038 Other specified hypothyroidism: Secondary | ICD-10-CM

## 2015-10-10 DIAGNOSIS — Z8701 Personal history of pneumonia (recurrent): Secondary | ICD-10-CM

## 2015-10-10 DIAGNOSIS — I10 Essential (primary) hypertension: Secondary | ICD-10-CM | POA: Diagnosis present

## 2015-10-10 DIAGNOSIS — J9620 Acute and chronic respiratory failure, unspecified whether with hypoxia or hypercapnia: Secondary | ICD-10-CM | POA: Diagnosis present

## 2015-10-10 DIAGNOSIS — Z923 Personal history of irradiation: Secondary | ICD-10-CM

## 2015-10-10 DIAGNOSIS — Z87891 Personal history of nicotine dependence: Secondary | ICD-10-CM

## 2015-10-10 DIAGNOSIS — R040 Epistaxis: Secondary | ICD-10-CM | POA: Diagnosis not present

## 2015-10-10 DIAGNOSIS — E039 Hypothyroidism, unspecified: Secondary | ICD-10-CM | POA: Diagnosis present

## 2015-10-10 DIAGNOSIS — J471 Bronchiectasis with (acute) exacerbation: Secondary | ICD-10-CM | POA: Diagnosis present

## 2015-10-10 DIAGNOSIS — Z931 Gastrostomy status: Secondary | ICD-10-CM

## 2015-10-10 DIAGNOSIS — J690 Pneumonitis due to inhalation of food and vomit: Secondary | ICD-10-CM | POA: Diagnosis not present

## 2015-10-10 DIAGNOSIS — Z881 Allergy status to other antibiotic agents status: Secondary | ICD-10-CM

## 2015-10-10 DIAGNOSIS — Z888 Allergy status to other drugs, medicaments and biological substances status: Secondary | ICD-10-CM

## 2015-10-10 DIAGNOSIS — E876 Hypokalemia: Secondary | ICD-10-CM | POA: Diagnosis present

## 2015-10-10 DIAGNOSIS — Z882 Allergy status to sulfonamides status: Secondary | ICD-10-CM

## 2015-10-10 DIAGNOSIS — R490 Dysphonia: Secondary | ICD-10-CM | POA: Diagnosis present

## 2015-10-10 DIAGNOSIS — J9621 Acute and chronic respiratory failure with hypoxia: Secondary | ICD-10-CM | POA: Diagnosis present

## 2015-10-10 DIAGNOSIS — J849 Interstitial pulmonary disease, unspecified: Secondary | ICD-10-CM | POA: Diagnosis present

## 2015-10-10 DIAGNOSIS — Z8249 Family history of ischemic heart disease and other diseases of the circulatory system: Secondary | ICD-10-CM

## 2015-10-10 DIAGNOSIS — Z79899 Other long term (current) drug therapy: Secondary | ICD-10-CM

## 2015-10-10 DIAGNOSIS — E440 Moderate protein-calorie malnutrition: Secondary | ICD-10-CM | POA: Diagnosis present

## 2015-10-10 DIAGNOSIS — Z833 Family history of diabetes mellitus: Secondary | ICD-10-CM

## 2015-10-10 DIAGNOSIS — Z880 Allergy status to penicillin: Secondary | ICD-10-CM

## 2015-10-10 HISTORY — DX: Other bursitis of elbow, unspecified elbow: M70.30

## 2015-10-10 HISTORY — DX: Bronchiectasis, uncomplicated: J47.9

## 2015-10-10 HISTORY — DX: Hypothyroidism, unspecified: E03.9

## 2015-10-10 HISTORY — DX: Malignant neoplasm of tonsil, unspecified: C09.9

## 2015-10-10 HISTORY — DX: Chronic obstructive pulmonary disease, unspecified: J44.9

## 2015-10-10 HISTORY — DX: Disease of digestive system, unspecified: K92.9

## 2015-10-10 HISTORY — DX: Myasthenia gravis without (acute) exacerbation: G70.00

## 2015-10-10 HISTORY — DX: Essential (primary) hypertension: I10

## 2015-10-10 LAB — COMPREHENSIVE METABOLIC PANEL
ALT: 15 U/L (ref 14–54)
AST: 27 U/L (ref 15–41)
Albumin: 4.5 g/dL (ref 3.5–5.0)
Alkaline Phosphatase: 71 U/L (ref 38–126)
Anion gap: 13 (ref 5–15)
BUN: 33 mg/dL — AB (ref 6–20)
CALCIUM: 9.6 mg/dL (ref 8.9–10.3)
CHLORIDE: 93 mmol/L — AB (ref 101–111)
CO2: 27 mmol/L (ref 22–32)
CREATININE: 1.07 mg/dL — AB (ref 0.44–1.00)
GFR, EST NON AFRICAN AMERICAN: 52 mL/min — AB (ref >60)
Glucose, Bld: 104 mg/dL — ABNORMAL HIGH (ref 65–99)
Potassium: 3.7 mmol/L (ref 3.5–5.1)
SODIUM: 133 mmol/L — AB (ref 135–145)
Total Bilirubin: 1.1 mg/dL (ref 0.3–1.2)
Total Protein: 7.5 g/dL (ref 6.5–8.1)

## 2015-10-10 LAB — CBC WITH DIFFERENTIAL/PLATELET
BASOS ABS: 0 10*3/uL (ref 0.0–0.1)
Basophils Relative: 0 %
Eosinophils Absolute: 0 10*3/uL (ref 0.0–0.7)
Eosinophils Relative: 0 %
HCT: 39 % (ref 36.0–46.0)
HEMOGLOBIN: 13 g/dL (ref 12.0–15.0)
LYMPHS ABS: 0.2 10*3/uL — AB (ref 0.7–4.0)
LYMPHS PCT: 4 %
MCH: 31.9 pg (ref 26.0–34.0)
MCHC: 33.3 g/dL (ref 30.0–36.0)
MCV: 95.8 fL (ref 78.0–100.0)
Monocytes Absolute: 0.4 10*3/uL (ref 0.1–1.0)
Monocytes Relative: 7 %
NEUTROS PCT: 89 %
Neutro Abs: 5.1 10*3/uL (ref 1.7–7.7)
Platelets: 121 10*3/uL — ABNORMAL LOW (ref 150–400)
RBC: 4.07 MIL/uL (ref 3.87–5.11)
RDW: 13.7 % (ref 11.5–15.5)
WBC: 5.7 10*3/uL (ref 4.0–10.5)

## 2015-10-10 LAB — URINALYSIS, ROUTINE W REFLEX MICROSCOPIC
BILIRUBIN URINE: NEGATIVE
Glucose, UA: NEGATIVE mg/dL
HGB URINE DIPSTICK: NEGATIVE
KETONES UR: NEGATIVE mg/dL
NITRITE: NEGATIVE
PH: 7.5 (ref 5.0–8.0)
Protein, ur: 30 mg/dL — AB
SPECIFIC GRAVITY, URINE: 1.019 (ref 1.005–1.030)

## 2015-10-10 LAB — URINE MICROSCOPIC-ADD ON

## 2015-10-10 MED ORDER — DOXAZOSIN MESYLATE 2 MG PO TABS
2.0000 mg | ORAL_TABLET | Freq: Every day | ORAL | Status: DC
  Administered 2015-10-10 – 2015-10-14 (×5): 2 mg via ORAL
  Filled 2015-10-10 (×6): qty 1

## 2015-10-10 MED ORDER — SODIUM CHLORIDE 0.9 % IV SOLN
INTRAVENOUS | Status: DC
  Administered 2015-10-10 – 2015-10-12 (×4): via INTRAVENOUS

## 2015-10-10 MED ORDER — SODIUM CHLORIDE 0.9 % IV BOLUS (SEPSIS)
500.0000 mL | Freq: Once | INTRAVENOUS | Status: AC
  Administered 2015-10-10: 500 mL via INTRAVENOUS

## 2015-10-10 MED ORDER — HYDRALAZINE HCL 25 MG PO TABS
25.0000 mg | ORAL_TABLET | Freq: Three times a day (TID) | ORAL | Status: DC
  Administered 2015-10-10 – 2015-10-15 (×16): 25 mg via ORAL
  Filled 2015-10-10 (×16): qty 1

## 2015-10-10 MED ORDER — MUPIROCIN 2 % EX OINT: TOPICAL_OINTMENT | CUTANEOUS | Status: DC | PRN

## 2015-10-10 MED ORDER — ROPINIROLE HCL 0.5 MG PO TABS
0.5000 mg | ORAL_TABLET | Freq: Every day | ORAL | Status: DC
  Administered 2015-10-10 – 2015-10-14 (×5): 0.5 mg via ORAL
  Filled 2015-10-10 (×6): qty 1

## 2015-10-10 MED ORDER — LEVOTHYROXINE SODIUM 50 MCG PO TABS
75.0000 ug | ORAL_TABLET | Freq: Every day | ORAL | Status: DC
  Administered 2015-10-11 – 2015-10-15 (×5): 75 ug via ORAL
  Filled 2015-10-10 (×5): qty 1

## 2015-10-10 MED ORDER — ONDANSETRON HCL 4 MG/2ML IJ SOLN
4.0000 mg | Freq: Once | INTRAMUSCULAR | Status: AC
  Administered 2015-10-10: 4 mg via INTRAVENOUS
  Filled 2015-10-10: qty 2

## 2015-10-10 MED ORDER — ENOXAPARIN SODIUM 40 MG/0.4ML ~~LOC~~ SOLN
40.0000 mg | SUBCUTANEOUS | Status: DC
  Administered 2015-10-10 – 2015-10-14 (×4): 40 mg via SUBCUTANEOUS
  Filled 2015-10-10 (×5): qty 0.4

## 2015-10-10 MED ORDER — ONDANSETRON HCL 4 MG/2ML IJ SOLN
4.0000 mg | Freq: Four times a day (QID) | INTRAMUSCULAR | Status: DC | PRN
  Administered 2015-10-10 – 2015-10-11 (×3): 4 mg via INTRAVENOUS
  Filled 2015-10-10 (×3): qty 2

## 2015-10-10 MED ORDER — CALCIUM CARBONATE ANTACID 500 MG PO CHEW
2.0000 | CHEWABLE_TABLET | Freq: Every day | ORAL | Status: DC
  Administered 2015-10-11 – 2015-10-15 (×5): 400 mg via ORAL
  Filled 2015-10-10 (×5): qty 2

## 2015-10-10 MED ORDER — CHLORTHALIDONE 25 MG PO TABS
12.5000 mg | ORAL_TABLET | Freq: Every day | ORAL | Status: DC
  Administered 2015-10-11 – 2015-10-12 (×2): 12.5 mg via ORAL
  Filled 2015-10-10 (×3): qty 0.5

## 2015-10-10 MED ORDER — ENSURE ENLIVE PO LIQD
0.5000 | Freq: Every day | ORAL | Status: DC
  Administered 2015-10-10 – 2015-10-11 (×3): 237 mL via ORAL

## 2015-10-10 MED ORDER — MUPIROCIN CALCIUM 2 % NA OINT
TOPICAL_OINTMENT | NASAL | Status: DC | PRN
  Filled 2015-10-10: qty 1

## 2015-10-10 MED ORDER — ALUM & MAG HYDROXIDE-SIMETH 200-200-20 MG/5ML PO SUSP: 30.0000 mL | Freq: Four times a day (QID) | ORAL | Status: DC | PRN

## 2015-10-10 MED ORDER — VITAMIN D 1000 UNITS PO TABS
1000.0000 [IU] | ORAL_TABLET | Freq: Every day | ORAL | Status: DC
  Administered 2015-10-11 – 2015-10-15 (×5): 1000 [IU] via ORAL
  Filled 2015-10-10 (×5): qty 1

## 2015-10-10 MED ORDER — IPRATROPIUM-ALBUTEROL 0.5-2.5 (3) MG/3ML IN SOLN
3.0000 mL | Freq: Once | RESPIRATORY_TRACT | Status: AC
  Administered 2015-10-10: 3 mL via RESPIRATORY_TRACT
  Filled 2015-10-10: qty 3

## 2015-10-10 MED ORDER — ONDANSETRON HCL 4 MG PO TABS: 4.0000 mg | ORAL_TABLET | Freq: Four times a day (QID) | ORAL | Status: DC | PRN

## 2015-10-10 MED ORDER — GUAIFENESIN 100 MG/5ML PO SYRP
200.0000 mg | ORAL_SOLUTION | Freq: Three times a day (TID) | ORAL | Status: DC | PRN
  Filled 2015-10-10: qty 10

## 2015-10-10 MED ORDER — PANTOPRAZOLE SODIUM 40 MG PO PACK
40.0000 mg | PACK | Freq: Every day | ORAL | Status: DC
  Administered 2015-10-10 – 2015-10-15 (×6): 40 mg
  Filled 2015-10-10 (×8): qty 20

## 2015-10-10 MED ORDER — LOSARTAN POTASSIUM 50 MG PO TABS
100.0000 mg | ORAL_TABLET | Freq: Every day | ORAL | Status: DC
  Administered 2015-10-11 – 2015-10-15 (×5): 100 mg via ORAL
  Filled 2015-10-10 (×5): qty 2

## 2015-10-10 MED ORDER — ACETAMINOPHEN 650 MG RE SUPP
650.0000 mg | Freq: Four times a day (QID) | RECTAL | Status: DC | PRN
  Administered 2015-10-14: 650 mg via RECTAL
  Filled 2015-10-10: qty 1

## 2015-10-10 MED ORDER — PYRIDOSTIGMINE BROMIDE 60 MG/5ML PO SYRP
60.0000 mg | ORAL_SOLUTION | Freq: Four times a day (QID) | ORAL | Status: DC
  Administered 2015-10-10 – 2015-10-15 (×13): 60 mg via ORAL
  Filled 2015-10-10 (×26): qty 5

## 2015-10-10 MED ORDER — ACETAMINOPHEN 325 MG PO TABS
650.0000 mg | ORAL_TABLET | Freq: Four times a day (QID) | ORAL | Status: DC | PRN
  Administered 2015-10-10 – 2015-10-11 (×2): 650 mg via ORAL
  Filled 2015-10-10 (×2): qty 2

## 2015-10-10 MED ORDER — MAGNESIUM HYDROXIDE 400 MG/5ML PO SUSP: 30.0000 mL | Freq: Every day | ORAL | Status: DC | PRN

## 2015-10-10 NOTE — ED Notes (Addendum)
Pt given room assignment: Elvina Sidle [id number] [facility]) and informed of transfer procedure.

## 2015-10-10 NOTE — H&P (Signed)
History and Physical:    Maria Vincent   [id number] DOB: 1945-12-21 DOA: 10/10/2015  Referring MD/provider: Dr. Ralene Bathe PCP: Thomes Dinning, MD   Chief Complaint: Nausea and vomiting  History of Present Illness:   Maria Vincent is an 70 y.o. female with a PMH of myasthenia gravis, COPD, bronchiectasis, tonsillar cancer s/p XRT and PEG/tube with history of aspiration pneumonia who presented to [facility] with a chief complaint of a three-day history of reflux of tube feedings followed by severe vomiting, chest pain and shortness of breath.  Over the past 24 hours, she was able to tolerate small feedings, water and her medications, but has not resumed her usual schedule of tube feedings.  She is concerned that she may have recurrent aspiration pneumonia due to the congestion in her chest.  Endorses some shortness of breath, but has chronic shortness of breath due to her COPD.  She has a cough productive of green mucous.  ROS:   Review of Systems  Constitutional: Positive for fever, weight loss and malaise/fatigue. Negative for chills.  HENT: Positive for congestion. Negative for sore throat.        Chest and nasal congestion  Respiratory: Positive for cough, sputum production and shortness of breath.   Cardiovascular: Positive for chest pain. Negative for palpitations, leg swelling and PND.  Gastrointestinal: Positive for heartburn, nausea and vomiting. Negative for abdominal pain, diarrhea, blood in stool and melena.  Genitourinary: Negative.   Musculoskeletal: Positive for joint pain.       Bursitis elbows  Skin: Negative.   Neurological: Positive for dizziness.  Endo/Heme/Allergies: Bruises/bleeds easily.  Psychiatric/Behavioral: Negative.      Past Medical History:   Past Medical History  Diagnosis Date  . Myasthenia gravis ([place]) 09/2007    Dr. Lauretta Chester [phone]  . Tonsillar cancer ([place])     Dr. Monica Becton [phone]  . Bronchiectasis ([place])     Dr.  Elta Guadeloupe Doner [phone]  . COPD (chronic obstructive pulmonary disease) ([place])   . High blood pressure     Dr. Effie Shy Masdonado [phone] : Extensivist  . GI disease     100 % NPO: G-tube: Dr. Governor Specking [phone]  . Bursitis of elbow   . Hypothyroidism (acquired)     Past Surgical History:   Past Surgical History  Procedure Laterality Date  . Gastrostomy tube placement  08/28/2007  . Rectocele repair  08/2005  . Foot surgery    . Abdominal hysterectomy  02/2006  . Appendectomy  10/1960    Social History:   Social History   Social History  . Marital Status: Single    Spouse Name: N/A  . Number of Children: 1  . Years of Education: N/A   Occupational History  . Retired from [employer]    Social History Main Topics  . Smoking status: Former Smoker    Quit date: 09/13/1984  . Smokeless tobacco: Never Used  . Alcohol Use: No  . Drug Use: No  . Sexual Activity: No   Other Topics Concern  . Not on file   Social History Narrative   Divorced. Lives with son and his family in a separate apartment in the home.  Ambulates without assistance at baseline.    Family history:   Family History  Problem Relation Age of Onset  . Hypertension Mother   . Heart attack Father   . Stroke Mother   . Stroke Maternal Grandfather   . Stroke Paternal  Grandfather   . Hypertension Sister   . Hypertension Sister   . Diabetes Sister     Allergies   Cephalosporins; Phenergan; Albuterol; Ambien; Clonidine derivatives; Demerol; Hctz; Novocain; Penicillins; Sulfur; Bacitracin; Biaxin; Merthiolate; Neosporin; and Polysporin  Current Medications:   Prior to Admission medications   Medication Sig Start Date End Date Taking? Authorizing Provider  Acetaminophen (TYLENOL PO) Take 30 mLs by mouth at bedtime.   Yes Historical Provider, MD  chlorthalidone (HYGROTON) 25 MG tablet Take 12.5 mg by mouth daily.   Yes Historical Provider, MD  Cholecalciferol (VITAMIN D) 400 UNIT/ML  LIQD Take 1,000 Units by mouth 1 day or 1 dose.   Yes Historical Provider, MD  DOXAZOSIN MESYLATE PO Take 2 mg by mouth at bedtime.   Yes Historical Provider, MD  Echinacea 400 MG CAPS Take by mouth 2 (two) times daily.   Yes Historical Provider, MD  guaifenesin (ROBITUSSIN) 100 MG/5ML syrup Take 200 mg by mouth 3 (three) times daily as needed for cough.   Yes Historical Provider, MD  hydrALAZINE (APRESOLINE) 25 MG tablet Take 25 mg by mouth 3 (three) times daily. 7am, 1pm, 7pm   Yes Historical Provider, MD  levothyroxine (SYNTHROID, LEVOTHROID) 75 MCG tablet Take 75 mcg by mouth daily before breakfast.   Yes Historical Provider, MD  loperamide (IMODIUM) 2 MG capsule Take by mouth as needed for diarrhea or loose stools.   Yes Historical Provider, MD  losartan (COZAAR) 100 MG tablet Take 100 mg by mouth daily.   Yes Historical Provider, MD  Melatonin 5 MG/15ML LIQD Take 5 mg by mouth at bedtime.   Yes Historical Provider, MD  Multiple Vitamins-Minerals (CAL-DAY 1000 PO) Take by mouth.   Yes Historical Provider, MD  Mupirocin Calcium (BACTROBAN NASAL NA) Place into the nose as needed (generic).   Yes Historical Provider, MD  NONFORMULARY OR COMPOUNDED ITEM Local Honey 1 tsp bid   Yes Historical Provider, MD  Nutritional Supplements (ISOSOURCE 1.5 CAL) LIQD Take 250 mLs by mouth 5 (five) times daily. 250 ml @ 7am, 1000 am, 1300 pm, 1600 pm and 125 ml @ 1900 pm.   Yes Historical Provider, MD  Ondansetron HCl (ZOFRAN PO) Take by mouth.   Yes Historical Provider, MD  OXYGEN Inhale 2 L into the lungs.   Yes Historical Provider, MD  Probiotic Product (PROBIOTIC DAILY PO) Take 620 mg by mouth 2 (two) times daily.   Yes Historical Provider, MD  Pseudoephedrine-DM-GG (ROBITUSSIN CF PO) Take by mouth.   Yes Historical Provider, MD  Pyridostigmine Bromide (MESTINON PO) Take 2 mLs by mouth 4 (four) times daily.   Yes Historical Provider, MD  rOPINIRole (REQUIP) 0.5 MG tablet Take 0.5 mg by mouth at bedtime.    Yes Historical Provider, MD  Sodium Fluoride (FLUORIDE MOUTH RINSE MT) Use as directed in the mouth or throat.   Yes Historical Provider, MD  Triprolidine-Pseudoephedrine (ANTIHISTAMINE PO) Take 25 mg by mouth as needed.   Yes Historical Provider, MD  Wheat Dextrin (BENEFIBER PO) Take 10 mLs by mouth 2 (two) times daily.   Yes Historical Provider, MD    Physical Exam:   Filed Vitals:   10/10/15 1331 10/10/15 1356 10/10/15 1400 10/10/15 1518  BP:   136/81 151/84  Pulse:  95  107  Temp:    98.3 F (36.8 C)  TempSrc:    Oral  Resp:  32 15 16  Height:    5\' 5"  (1.651 m)  Weight:    58.3 kg (128  lb 8.5 oz)  SpO2: 96% 96%  98%     Physical Exam: Blood pressure 151/84, pulse 107, temperature 98.3 F (36.8 C), temperature source Oral, resp. rate 16, height 5\' 5"  (1.651 m), weight 58.3 kg (128 lb 8.5 oz), SpO2 98 %. Gen: No acute distress. Head: Normocephalic, atraumatic. Eyes: PERRL, EOMI, sclerae nonicteric. Mouth: Oropharynx is some posterior pharyngeal erythema. Neck: Supple, no thyromegaly, no lymphadenopathy, no jugular venous distention. Chest: Lungs diminished with occasional rhonchi. CV: Heart sounds are slightly irregular. No murmurs, rubs, or gallops. Abdomen: Soft, nontender, nondistended with normal active bowel sounds. Extremities: Extremities are without clubbing, edema, or cyanosis. Skin: Warm and dry. Neuro: Alert and oriented times 3, grossly intact. Psych: Mood and affect normal.   Data Review:    Labs: Basic Metabolic Panel:  Recent Labs Lab 10/10/15 0940  NA 133*  K 3.7  CL 93*  CO2 27  GLUCOSE 104*  BUN 33*  CREATININE 1.07*  CALCIUM 9.6   Liver Function Tests:  Recent Labs Lab 10/10/15 0940  AST 27  ALT 15  ALKPHOS 71  BILITOT 1.1  PROT 7.5  ALBUMIN 4.5   CBC:  Recent Labs Lab 10/10/15 0940  WBC 5.7  NEUTROABS 5.1  HGB 13.0  HCT 39.0  MCV 95.8  PLT 121*    Radiographic Studies: Dg Chest Port 1 View  10/10/2015  CLINICAL  DATA:  Shortness of breath.  Vomiting. EXAM: PORTABLE CHEST 1 VIEW COMPARISON:  10/02/2015 and 06/13/2015 and CT scan of the chest dated 03/20/2015 FINDINGS: Chronic slight cardiomegaly. Pulmonary vascularity is normal. No infiltrates or effusions. Chronic accentuation of the interstitial markings. No acute osseous abnormality. Sclerotic lesion in the proximal humerus, unchanged since 08/29/2013 and probably representing a bone infarct or benign chondroid lesion. IMPRESSION: No acute abnormality. Chronic cardiomegaly and chronic interstitial lung disease. Electronically Signed   By: Lorriane Shire M.D.   On: 10/10/2015 09:36   *I have personally reviewed the images above*  EKG:  Independently reviewed. Sinus rhythm at 82 bpm, premature atrial complexes with a left anterior fascicular block and abnormal R-wave progression.  Assessment/Plan:   Principal Problem:   Acute on chronic respiratory failure ([place]) with hypoxia/history of COPD/chronic interstitial lung disease - Patient uses 2 L of oxygen at home at bedtime, but was hypoxic on arrival. - Maintain supplemental oxygen. - Suspect aspiration pneumonitis given recent nausea and vomiting. No evidence of infection. - Monitor closely for exacerbation of COPD/bronchiectasis.  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  - Continue tube feeds. Nothing by mouth.    Nausea and vomiting - Seems to be self-limited. Zofran as needed. Can try Reglan if nausea and vomiting return.    Myasthenia gravis ([place]) - Continue Mestinon.    Hypothyroidism - Acquired secondary to history of radiation therapy. Continue Synthroid.    Essential hypertension - Continue chlorthalidone, Cozaar and hydralazine.    DVT prophylaxis - Lovenox ordered.  Code Status / Family Communication / Disposition Plan:   Code Status: Full. Confirmed with the patient. Family  Communication: Son, Maria Vincent is emergency contact (not present), is also her POA. Disposition Plan: Home when stable.   Time spent: One hour.  Rogenia Werntz [facility] Pager [phone] Cell: [phone]   If 7PM-7AM, please contact night-coverage [email or web address] Password [facility] 10/10/2015, 4:53 PM

## 2015-10-10 NOTE — ED Notes (Signed)
Patient and family member states the patient developed refux approximately 3 hours after eating.  States the reflux became worse yesterday and she started vomiting last night.  Patient has extensive history and is 100 % npo with a g tube for all medications and nutrition.  Patient has a history of aspiration.  Vomiting has been associated with generalized fatigue, productive cough with green colored secretions, chills and sweats.

## 2015-10-10 NOTE — Progress Notes (Signed)
Patient vomited x 2 tonight. Emesis thick and yellow. Tube feeding scheduled for 1900 not given. Zofran 4 mg IV given at 2030. Will continue to monitor.

## 2015-10-10 NOTE — ED Provider Notes (Signed)
CSN: [id number]     Arrival date & time 10/10/15  0847 History   None    Chief Complaint  Patient presents with  . Emesis      The history is provided by the patient. No language interpreter was used.   Maria Vincent is a 70 y.o. female w/ hx/o myasthenia gravis, severe COPD, aspiration pneumonia who presents to the Emergency Department complaining of vomiting. She developed acid reflux type symptoms 3 days ago and yesterday after a tube feeding she developed severe vomiting. Following that she's developed some chest pain and shortness of breath. She is concern for possible aspiration and has a history of such. She reports that her breathing feels "raspy". She uses supplemental oxygen at night but none during the day. No fevers, abdominal pain, diarrhea, constipation. Last bowel movement was yesterday and it was normal.  symptoms are severe, constant, worsening.   Past Medical History  Diagnosis Date  . Myasthenia gravis ([place]) 09/2007    Dr. Lauretta Chester [phone]  . 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 Monica Becton [phone]  . Bronchiectasis ([place])     Dr. Elta Guadeloupe Doner [phone]  . COPD (chronic obstructive pulmonary disease) ([place])   . High blood pressure     Dr. Effie Shy Masdonado [phone] : Extensivist  . GI disease     100 % NPO: G-tube: Dr. Governor Specking [phone]  . Bursitis of elbow   . Hypothyroidism (acquired)    Past Surgical History  Procedure Laterality Date  . Gastrostomy tube placement  08/28/2007  . Rectocele repair  08/2005  . Foot surgery    . Abdominal hysterectomy  02/2006  . Appendectomy  10/1960   Family History  Problem Relation Age of Onset  . Hypertension Mother   . Heart attack Father   . Stroke Mother   . Stroke Maternal Grandfather   . Stroke Paternal Grandfather   . Hypertension Sister   . Hypertension Sister   . Diabetes Sister    Social History  Substance Use Topics  . Smoking status: Former Smoker    Quit date:  09/13/1984  . Smokeless tobacco: Never Used  . Alcohol Use: No   OB History    No data available     Review of Systems  All other systems reviewed and are negative.     Allergies  Cephalosporins; Phenergan; Albuterol; Ambien; Clonidine derivatives; Demerol; Hctz; Novocain; Penicillins; Sulfur; Bacitracin; Biaxin; Merthiolate; Neosporin; and Polysporin  Home Medications   Prior to Admission medications   Medication Sig Start Date End Date Taking? Authorizing Provider  Acetaminophen (TYLENOL PO) Take 30 mLs by mouth at bedtime.   Yes Historical Provider, MD  calcium carbonate (TUMS - DOSED IN MG ELEMENTAL CALCIUM) 500 MG chewable tablet Chew 2 tablets by mouth daily.   Yes Historical Provider, MD  chlorthalidone (HYGROTON) 25 MG tablet Take 12.5 mg by mouth daily.   Yes Historical Provider, MD  Cholecalciferol (VITAMIN D) 400 UNIT/ML LIQD Take 1,000 Units by mouth 1 day or 1 dose.   Yes Historical Provider, MD  DOXAZOSIN MESYLATE PO Take 2 mg by mouth at bedtime.   Yes Historical Provider, MD  Echinacea 400 MG CAPS Take 1 capsule by mouth 2 (two) times daily.    Yes Historical Provider, MD  guaifenesin (ROBITUSSIN) 100 MG/5ML syrup Take 200 mg by mouth 3 (three) times daily as needed for cough.   Yes Historical Provider, MD  hydrALAZINE (APRESOLINE) 25 MG  tablet Take 25 mg by mouth 3 (three) times daily. 7am, 1pm, 7pm   Yes Historical Provider, MD  levothyroxine (SYNTHROID, LEVOTHROID) 75 MCG tablet Take 75 mcg by mouth daily before breakfast.   Yes Historical Provider, MD  loperamide (IMODIUM) 2 MG capsule Take by mouth as needed for diarrhea or loose stools.   Yes Historical Provider, MD  losartan (COZAAR) 100 MG tablet Take 100 mg by mouth daily.   Yes Historical Provider, MD  Melatonin 5 MG/15ML LIQD Take 5 mg by mouth at bedtime.   Yes Historical Provider, MD  Mupirocin Calcium (BACTROBAN NASAL NA) Place into the nose as needed (generic).   Yes Historical Provider, MD   NONFORMULARY OR COMPOUNDED ITEM Reported on 10/10/2015   Yes Historical Provider, MD  Nutritional Supplements (ISOSOURCE 1.5 CAL) LIQD Take 250 mLs by mouth 5 (five) times daily. 250 ml @ 7am, 1000 am, 1300 pm, 1600 pm and 125 ml @ 1900 pm.   Yes Historical Provider, MD  OXYGEN Inhale 2 L into the lungs.   Yes Historical Provider, MD  Probiotic Product (PROBIOTIC DAILY PO) Take 620 mg by mouth 2 (two) times daily.   Yes Historical Provider, MD  rOPINIRole (REQUIP) 0.5 MG tablet Take 0.5 mg by mouth at bedtime.   Yes Historical Provider, MD  Sodium Fluoride (FLUORIDE MOUTH RINSE MT) Use as directed in the mouth or throat.   Yes Historical Provider, MD  Triprolidine-Pseudoephedrine (ANTIHISTAMINE PO) Take 25 mg by mouth at bedtime.    Yes Historical Provider, MD  Wheat Dextrin (BENEFIBER PO) Take 10 mLs by mouth 2 (two) times daily.   Yes Historical Provider, MD   BP 151/84 mmHg  Pulse 107  Temp(Src) 98.3 F (36.8 C) (Oral)  Resp 16  Ht 5\' 5"  (1.651 m)  Wt 128 lb 8.5 oz (58.3 kg)  BMI 21.39 kg/m2  SpO2 98% Physical Exam  Constitutional: She is oriented to person, place, and time. She appears well-developed and well-nourished.  Uncomfortable appearing  HENT:  Head: Normocephalic and atraumatic.  Cardiovascular: Normal rate and regular rhythm.   No murmur heard. Pulmonary/Chest: Effort normal. No respiratory distress.  Decreased air movements in all lung fields.  Abdominal: Soft. There is no rebound and no guarding.  Actively vomiting on initial examination. No significant abdominal tenderness. Gastrostomy tube in the upper abdomen without surrounding erythema or tenderness.  Musculoskeletal: She exhibits no edema or tenderness.  Neurological: She is alert and oriented to person, place, and time.  Skin: Skin is warm and dry.  Psychiatric: She has a normal mood and affect. Her behavior is normal.  Nursing note and vitals reviewed.   ED Course  Procedures (including critical care  time) Labs Review Labs Reviewed  CBC WITH DIFFERENTIAL/PLATELET - Abnormal; Notable for the following:    Platelets 121 (*)    Lymphs Abs 0.2 (*)    All other components within normal limits  COMPREHENSIVE METABOLIC PANEL - Abnormal; Notable for the following:    Sodium 133 (*)    Chloride 93 (*)    Glucose, Bld 104 (*)    BUN 33 (*)    Creatinine, Ser 1.07 (*)    GFR calc non Af Amer 52 (*)    All other components within normal limits  URINALYSIS, ROUTINE W REFLEX MICROSCOPIC (NOT AT [facility]) - Abnormal; Notable for the following:    Color, Urine AMBER (*)    APPearance CLOUDY (*)    Protein, ur 30 (*)    Leukocytes, UA  TRACE (*)    All other components within normal limits  URINE MICROSCOPIC-ADD ON - Abnormal; Notable for the following:    Squamous Epithelial / LPF 0-5 (*)    Bacteria, UA RARE (*)    Casts HYALINE CASTS (*)    All other components within normal limits  URINE CULTURE  CBC  CREATININE, SERUM    Imaging Review Dg Chest Port 1 View  10/10/2015  CLINICAL DATA:  Shortness of breath.  Vomiting. EXAM: PORTABLE CHEST 1 VIEW COMPARISON:  10/02/2015 and 06/13/2015 and CT scan of the chest dated 03/20/2015 FINDINGS: Chronic slight cardiomegaly. Pulmonary vascularity is normal. No infiltrates or effusions. Chronic accentuation of the interstitial markings. No acute osseous abnormality. Sclerotic lesion in the proximal humerus, unchanged since 08/29/2013 and probably representing a bone infarct or benign chondroid lesion. IMPRESSION: No acute abnormality. Chronic cardiomegaly and chronic interstitial lung disease. Electronically Signed   By: Lorriane Shire M.D.   On: 10/10/2015 09:36   I have personally reviewed and evaluated these images and lab results as part of my medical decision-making.   EKG Interpretation   Date/Time:  Friday October 10 2015 09:46:39 EST Ventricular Rate:  82 PR Interval:  166 QRS Duration: 70 QT Interval:  393 QTC Calculation: 459 R Axis:    -88 Text Interpretation:  Sinus rhythm Atrial premature complex Left anterior  fascicular block Abnormal R-wave progression, late transition Artifact in  lead(s) I III aVR aVL aVF Confirmed by Hazle Coca [phone]) on 10/10/2015  10:02:52 AM      MDM   Final diagnoses:  Hypoxia    Patient with history of severe COPD as well as myasthenia gravis with dysphagia and chronic PEG tube use. She is here for nausea and vomiting since yesterday with shortness of breath and raspy breathing, has a history of aspiration. On initial evaluation patient with decreased air movement bilaterally, retching on examination. Nausea and vomiting improved following administration of antiemetics. On repeat lung exam after albuterol treatment and she has improved aeration with occasional crackles in the lung bases. She has no respiratory distress but when the supplemental oxygen is removed her sats will drop to 86%. Plan to admit for observation for her hypoxia, hopefully this is transient elated to a chemical pneumonitis/aspiration. There is no current evidence of pneumonia, CHF.  Quintella Reichert, MD 10/10/15 [phone]

## 2015-10-11 DIAGNOSIS — Z888 Allergy status to other drugs, medicaments and biological substances status: Secondary | ICD-10-CM | POA: Diagnosis not present

## 2015-10-11 DIAGNOSIS — J9621 Acute and chronic respiratory failure with hypoxia: Secondary | ICD-10-CM | POA: Diagnosis present

## 2015-10-11 DIAGNOSIS — Z6821 Body mass index (BMI) 21.0-21.9, adult: Secondary | ICD-10-CM | POA: Diagnosis not present

## 2015-10-11 DIAGNOSIS — E876 Hypokalemia: Secondary | ICD-10-CM | POA: Diagnosis present

## 2015-10-11 DIAGNOSIS — Z8701 Personal history of pneumonia (recurrent): Secondary | ICD-10-CM | POA: Diagnosis not present

## 2015-10-11 DIAGNOSIS — R112 Nausea with vomiting, unspecified: Secondary | ICD-10-CM | POA: Diagnosis present

## 2015-10-11 DIAGNOSIS — Z923 Personal history of irradiation: Secondary | ICD-10-CM | POA: Diagnosis not present

## 2015-10-11 DIAGNOSIS — E038 Other specified hypothyroidism: Secondary | ICD-10-CM | POA: Diagnosis not present

## 2015-10-11 DIAGNOSIS — E039 Hypothyroidism, unspecified: Secondary | ICD-10-CM | POA: Diagnosis present

## 2015-10-11 DIAGNOSIS — Z885 Allergy status to narcotic agent status: Secondary | ICD-10-CM | POA: Diagnosis not present

## 2015-10-11 DIAGNOSIS — J690 Pneumonitis due to inhalation of food and vomit: Secondary | ICD-10-CM | POA: Diagnosis present

## 2015-10-11 DIAGNOSIS — I10 Essential (primary) hypertension: Secondary | ICD-10-CM | POA: Diagnosis present

## 2015-10-11 DIAGNOSIS — Z882 Allergy status to sulfonamides status: Secondary | ICD-10-CM | POA: Diagnosis not present

## 2015-10-11 DIAGNOSIS — Z880 Allergy status to penicillin: Secondary | ICD-10-CM | POA: Diagnosis not present

## 2015-10-11 DIAGNOSIS — Z79899 Other long term (current) drug therapy: Secondary | ICD-10-CM | POA: Diagnosis not present

## 2015-10-11 DIAGNOSIS — G7000 Myasthenia gravis without (acute) exacerbation: Secondary | ICD-10-CM | POA: Diagnosis present

## 2015-10-11 DIAGNOSIS — Z823 Family history of stroke: Secondary | ICD-10-CM | POA: Diagnosis not present

## 2015-10-11 DIAGNOSIS — J849 Interstitial pulmonary disease, unspecified: Secondary | ICD-10-CM | POA: Diagnosis not present

## 2015-10-11 DIAGNOSIS — Z9981 Dependence on supplemental oxygen: Secondary | ICD-10-CM | POA: Diagnosis not present

## 2015-10-11 DIAGNOSIS — Z8249 Family history of ischemic heart disease and other diseases of the circulatory system: Secondary | ICD-10-CM | POA: Diagnosis not present

## 2015-10-11 DIAGNOSIS — Z87891 Personal history of nicotine dependence: Secondary | ICD-10-CM | POA: Diagnosis not present

## 2015-10-11 DIAGNOSIS — R040 Epistaxis: Secondary | ICD-10-CM | POA: Diagnosis not present

## 2015-10-11 DIAGNOSIS — R490 Dysphonia: Secondary | ICD-10-CM | POA: Diagnosis present

## 2015-10-11 DIAGNOSIS — Z884 Allergy status to anesthetic agent status: Secondary | ICD-10-CM | POA: Diagnosis not present

## 2015-10-11 DIAGNOSIS — Z931 Gastrostomy status: Secondary | ICD-10-CM | POA: Diagnosis not present

## 2015-10-11 DIAGNOSIS — Z881 Allergy status to other antibiotic agents status: Secondary | ICD-10-CM | POA: Diagnosis not present

## 2015-10-11 DIAGNOSIS — J471 Bronchiectasis with (acute) exacerbation: Secondary | ICD-10-CM | POA: Diagnosis present

## 2015-10-11 DIAGNOSIS — I509 Heart failure, unspecified: Secondary | ICD-10-CM | POA: Diagnosis not present

## 2015-10-11 DIAGNOSIS — Z833 Family history of diabetes mellitus: Secondary | ICD-10-CM | POA: Diagnosis not present

## 2015-10-11 DIAGNOSIS — K219 Gastro-esophageal reflux disease without esophagitis: Secondary | ICD-10-CM | POA: Diagnosis present

## 2015-10-11 DIAGNOSIS — E871 Hypo-osmolality and hyponatremia: Secondary | ICD-10-CM | POA: Diagnosis present

## 2015-10-11 DIAGNOSIS — C099 Malignant neoplasm of tonsil, unspecified: Secondary | ICD-10-CM | POA: Diagnosis present

## 2015-10-11 DIAGNOSIS — E440 Moderate protein-calorie malnutrition: Secondary | ICD-10-CM | POA: Diagnosis present

## 2015-10-11 LAB — EXPECTORATED SPUTUM ASSESSMENT W REFEX TO RESP CULTURE

## 2015-10-11 LAB — URINE CULTURE

## 2015-10-11 LAB — EXPECTORATED SPUTUM ASSESSMENT W GRAM STAIN, RFLX TO RESP C

## 2015-10-11 LAB — MRSA PCR SCREENING: MRSA by PCR: NEGATIVE

## 2015-10-11 MED ORDER — METOCLOPRAMIDE HCL 5 MG/ML IJ SOLN
5.0000 mg | Freq: Three times a day (TID) | INTRAMUSCULAR | Status: DC
  Administered 2015-10-11 – 2015-10-15 (×13): 5 mg via INTRAVENOUS
  Filled 2015-10-11 (×13): qty 2

## 2015-10-11 MED ORDER — GUAIFENESIN 100 MG/5ML PO SOLN
5.0000 mL | Freq: Three times a day (TID) | ORAL | Status: DC
  Administered 2015-10-11 – 2015-10-14 (×9): 100 mg
  Administered 2015-10-14: 5 mg
  Administered 2015-10-14 – 2015-10-15 (×3): 100 mg
  Filled 2015-10-11 (×4): qty 10
  Filled 2015-10-11: qty 20
  Filled 2015-10-11 (×4): qty 10
  Filled 2015-10-11: qty 20
  Filled 2015-10-11 (×2): qty 10
  Filled 2015-10-11: qty 20

## 2015-10-11 MED ORDER — ISOSOURCE 1.5 CAL PO LIQD
125.0000 mL | ORAL | Status: DC
  Administered 2015-10-11 – 2015-10-14 (×4): 125 mL via ORAL

## 2015-10-11 MED ORDER — ISOSOURCE 1.5 CAL PO LIQD
250.0000 mL | ORAL | Status: DC
  Administered 2015-10-12 – 2015-10-15 (×9): 250 mL via ORAL

## 2015-10-11 MED ORDER — CHLORHEXIDINE GLUCONATE 0.12 % MT SOLN
15.0000 mL | Freq: Two times a day (BID) | OROMUCOSAL | Status: DC
  Administered 2015-10-11 – 2015-10-15 (×5): 15 mL via OROMUCOSAL
  Filled 2015-10-11 (×6): qty 15

## 2015-10-11 MED ORDER — HYDRALAZINE HCL 20 MG/ML IJ SOLN
10.0000 mg | Freq: Four times a day (QID) | INTRAMUSCULAR | Status: DC | PRN
  Administered 2015-10-13: 10 mg via INTRAVENOUS
  Filled 2015-10-11: qty 1

## 2015-10-11 MED ORDER — CETYLPYRIDINIUM CHLORIDE 0.05 % MT LIQD
7.0000 mL | Freq: Two times a day (BID) | OROMUCOSAL | Status: DC
  Administered 2015-10-11 (×2): 7 mL via OROMUCOSAL

## 2015-10-11 NOTE — Progress Notes (Signed)
Rt gave pt flutter valve. Pt knows and understands how to use. Rt placed chest vest in room for rt to start on pm shift.

## 2015-10-11 NOTE — Progress Notes (Signed)
RT attempted Chest Vest on Pt for about a minute, pt became nauseated and felt like she couldn't tolerate Vest.  RT will attempt again in the am.  However RT had Pt work on flutter valve while in the room.  RT to monitor and assess as needed.

## 2015-10-11 NOTE — Progress Notes (Signed)
PROGRESS NOTE  Maria Vincent [id number] DOB: Aug 28, 1946 DOA: 10/10/2015 PCP: Maria Dinning, MD  HPI/Recap of past 24 hours:  Productive cough with thick green sputum, on 2liter oxygen, not in distress, able to ambulate  Assessment/Plan: Principal Problem:   Acute on chronic respiratory failure (HCC) Active Problems:   Tonsillar cancer (HCC)   Hypoxia   S/P percutaneous endoscopic gastrostomy (PEG) tube placement (HCC)   Nausea and vomiting   Chronic interstitial lung disease ([place])   Myasthenia gravis ([place])   Hypothyroidism   Essential hypertension  Acute on chronic respiratory failure ([place]) with hypoxia/history of COPD/chronic interstitial lung disease - Patient uses 2 L of oxygen at home at bedtime, but was hypoxic on arrival. 86% - Maintain supplemental oxygen. - Suspect aspiration pneumonitis given recent nausea and vomiting. Patient does has h/o bronchiectasis, ? Flare up of bronchiectasis?, start chest vest, flutter valve, mucinex, nebs, currently no significant wheezing on exam.  Active Problems:  Tonsillar cancer (HCC) status post percutaneous endoscopic gastrostomy tube placement - Treated with radiation therapy. baseline hoarseness. - Continue tube feeds. Nothing by mouth.   Nausea and vomiting - Seems to be self-limited. Zofran as needed.  -start reglan,    Myasthenia gravis ([place]) - Continue Mestinon. Able to ambulate, has fair cough effort.   Hypothyroidism - Acquired secondary to history of radiation therapy. Continue Synthroid.   Essential hypertension - Continue chlorthalidone, Cozaar and hydralazine. -not well controlled, continue titrate meds   DVT prophylaxis - Lovenox ordered.  Code Status: Full. Confirmed with the patient. Family Communication: Son, Maria Vincent is emergency contact (not present), is also her POA. Disposition Plan: Home when  stable.   Consultants:  none  Procedures:  none  Antibiotics:  none   Objective: BP 190/81 mmHg  Pulse 96  Temp(Src) 98.1 F (36.7 C) (Oral)  Resp 16  Ht 5\' 5"  (1.651 m)  Wt 58.3 kg (128 lb 8.5 oz)  BMI 21.39 kg/m2  SpO2 100%  Intake/Output Summary (Last 24 hours) at 10/11/15 1553 Last data filed at 10/11/15 0644  Gross per 24 hour  Intake 1003.9 ml  Output    600 ml  Net  403.9 ml   Filed Weights   10/10/15 0918 10/10/15 1518  Weight: 56.7 kg (125 lb) 58.3 kg (128 lb 8.5 oz)    Exam:   General:  NAD, does has coughing spells with thick green sputum, able to ambulate  Cardiovascular: RRR  Respiratory: crackles, no significant wheezing  Abdomen: Soft/ND/NT, positive BS, peg tube in place  Musculoskeletal: No Edema  Neuro: aaox3  Data Reviewed: Basic Metabolic Panel:  Recent Labs Lab 10/10/15 0940  NA 133*  K 3.7  CL 93*  CO2 27  GLUCOSE 104*  BUN 33*  CREATININE 1.07*  CALCIUM 9.6   Liver Function Tests:  Recent Labs Lab 10/10/15 0940  AST 27  ALT 15  ALKPHOS 71  BILITOT 1.1  PROT 7.5  ALBUMIN 4.5   No results for input(s): LIPASE, AMYLASE in the last 168 hours. No results for input(s): AMMONIA in the last 168 hours. CBC:  Recent Labs Lab 10/10/15 0940  WBC 5.7  NEUTROABS 5.1  HGB 13.0  HCT 39.0  MCV 95.8  PLT 121*   Cardiac Enzymes:   No results for input(s): CKTOTAL, CKMB, CKMBINDEX, TROPONINI in the last 168 hours. BNP (last 3 results) No results for input(s): BNP in the last 8760 hours.  ProBNP (last 3 results) No results for input(s): PROBNP in the last 8760  hours.  CBG: No results for input(s): GLUCAP in the last 168 hours.  Recent Results (from the past 240 hour(s))  Urine culture     Status: None   Collection Time: 10/10/15 11:20 AM  Result Value Ref Range Status   Specimen Description URINE, CLEAN CATCH  Final   Special Requests NONE  Final   Culture   Final    MULTIPLE SPECIES PRESENT, SUGGEST  RECOLLECTION Performed at [facility]    Report Status 10/11/2015 FINAL  Final     Studies: No results found.  Scheduled Meds: . antiseptic oral rinse  7 mL Mouth Rinse q12n4p  . calcium carbonate  2 tablet Oral Daily  . chlorhexidine  15 mL Mouth Rinse BID  . chlorthalidone  12.5 mg Oral Daily  . cholecalciferol  1,000 Units Oral Daily  . doxazosin  2 mg Oral QHS  . enoxaparin (LOVENOX) injection  40 mg Subcutaneous Q24H  . feeding supplement (ENSURE ENLIVE)  0.5-1 Bottle Oral 5 X Daily  . hydrALAZINE  25 mg Oral TID  . levothyroxine  75 mcg Oral QAC breakfast  . losartan  100 mg Oral Daily  . pantoprazole sodium  40 mg Per Tube Daily  . pyridostigmine  60 mg Oral 4 times per day  . rOPINIRole  0.5 mg Oral QHS    Continuous Infusions: . sodium chloride 75 mL/hr at 10/11/15 0539     Time spent: 52mins  Ellicia Alix MD, PhD  [facility] Pager [phone]. If 7PM-7AM, please contact night-coverage at [email or web address], password [email or web address] 10/11/2015, 3:53 PM

## 2015-10-12 LAB — BASIC METABOLIC PANEL
Anion gap: 10 (ref 5–15)
BUN: 29 mg/dL — AB (ref 6–20)
CALCIUM: 8.8 mg/dL — AB (ref 8.9–10.3)
CHLORIDE: 97 mmol/L — AB (ref 101–111)
CO2: 29 mmol/L (ref 22–32)
CREATININE: 0.65 mg/dL (ref 0.44–1.00)
Glucose, Bld: 100 mg/dL — ABNORMAL HIGH (ref 65–99)
Potassium: 3.1 mmol/L — ABNORMAL LOW (ref 3.5–5.1)
SODIUM: 136 mmol/L (ref 135–145)

## 2015-10-12 LAB — CBC
HCT: 33.8 % — ABNORMAL LOW (ref 36.0–46.0)
HEMOGLOBIN: 11.2 g/dL — AB (ref 12.0–15.0)
MCH: 32.4 pg (ref 26.0–34.0)
MCHC: 33.1 g/dL (ref 30.0–36.0)
MCV: 97.7 fL (ref 78.0–100.0)
PLATELETS: 113 10*3/uL — AB (ref 150–400)
RBC: 3.46 MIL/uL — ABNORMAL LOW (ref 3.87–5.11)
RDW: 13.8 % (ref 11.5–15.5)
WBC: 3.6 10*3/uL — ABNORMAL LOW (ref 4.0–10.5)

## 2015-10-12 LAB — TSH: TSH: 5.225 u[IU]/mL — AB (ref 0.350–4.500)

## 2015-10-12 LAB — MAGNESIUM: MAGNESIUM: 1.6 mg/dL — AB (ref 1.7–2.4)

## 2015-10-12 MED ORDER — POTASSIUM CHLORIDE 20 MEQ/15ML (10%) PO SOLN
40.0000 meq | Freq: Once | ORAL | Status: AC
  Administered 2015-10-12: 40 meq
  Filled 2015-10-12: qty 30

## 2015-10-12 MED ORDER — MAGNESIUM SULFATE 2 GM/50ML IV SOLN
2.0000 g | Freq: Once | INTRAVENOUS | Status: AC
  Administered 2015-10-12: 2 g via INTRAVENOUS
  Filled 2015-10-12: qty 50

## 2015-10-12 NOTE — Progress Notes (Signed)
PROGRESS NOTE  Maria Vincent [id number] DOB: 27-Dec-1945 DOA: 10/10/2015 PCP: Thomes Dinning, MD  HPI/Recap of past 24 hours:  Feeling better, less productive cough, on 2liter oxygen, not in distress, able to ambulate Feeling nauseated while attempting chest vest,  No vomiting  Assessment/Plan: Principal Problem:   Acute on chronic respiratory failure (HCC) Active Problems:   Tonsillar cancer (HCC)   Hypoxia   S/P percutaneous endoscopic gastrostomy (PEG) tube placement (HCC)   Nausea and vomiting   Chronic interstitial lung disease ([place])   Myasthenia gravis ([place])   Hypothyroidism   Essential hypertension  Acute on chronic respiratory failure ([place]) with hypoxia/history of COPD/chronic interstitial lung disease - Patient uses 2 L of oxygen at home at bedtime, but was hypoxic on arrival. 86% - Maintain supplemental oxygen. - 1/28Suspect aspiration pneumonitis given recent nausea and vomiting. Patient does has h/o bronchiectasis, ? Flare up of bronchiectasis?, start chest vest, flutter valve, mucinex, nebs, currently no significant wheezing on exam. -1/29 better, less cough, lung exam has improved, continue currently regimen, continue chest PT.   Nausea and vomiting - Seems to be self-limited. Zofran as needed.  -start reglan,   Hypokalemia, hypomagnesemia: replace k and mag, repeat labs in am.   Myasthenia gravis ([place]) - Continue Mestinon. Able to ambulate, has fair cough effort.  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 baseline hoarseness. - Continue tube feeds. Nothing by mouth.    Hypothyroidism - Acquired secondary to history of radiation therapy. Continue Synthroid.   Essential hypertension - Continue chlorthalidone, Cozaar and hydralazine. -not well controlled, continue titrate meds   DVT prophylaxis - Lovenox ordered.  Code Status: Full. Confirmed with the  patient. Family Communication: Son, [name] is emergency contact (not present), is also her POA. Disposition Plan: Home when stable.   Consultants:  none  Procedures:  none  Antibiotics:  none   Objective: BP 151/85 mmHg  Pulse 90  Temp(Src) 98.3 F (36.8 C) (Oral)  Resp 16  Ht 5\' 5"  (1.651 m)  Wt 58.3 kg (128 lb 8.5 oz)  BMI 21.39 kg/m2  SpO2 93%  Intake/Output Summary (Last 24 hours) at 10/12/15 0818 Last data filed at 10/12/15 0647  Gross per 24 hour  Intake   1787 ml  Output      0 ml  Net   1787 ml   Filed Weights   10/10/15 0918 10/10/15 1518  Weight: 56.7 kg (125 lb) 58.3 kg (128 lb 8.5 oz)    Exam:   General:  NAD, no coughing spells observed today, able to ambulate  Cardiovascular: RRR  Respiratory: less crackles, no significant wheezing  Abdomen: Soft/ND/NT, positive BS, peg tube in place  Musculoskeletal: No Edema  Neuro: aaox3  Data Reviewed: Basic Metabolic Panel:  Recent Labs Lab 10/10/15 0940 10/12/15 0420  NA 133* 136  K 3.7 3.1*  CL 93* 97*  CO2 27 29  GLUCOSE 104* 100*  BUN 33* 29*  CREATININE 1.07* 0.65  CALCIUM 9.6 8.8*  MG  --  1.6*   Liver Function Tests:  Recent Labs Lab 10/10/15 0940  AST 27  ALT 15  ALKPHOS 71  BILITOT 1.1  PROT 7.5  ALBUMIN 4.5   No results for input(s): LIPASE, AMYLASE in the last 168 hours. No results for input(s): AMMONIA in the last 168 hours. CBC:  Recent Labs Lab 10/10/15 0940 10/12/15 0420  WBC 5.7 3.6*  NEUTROABS 5.1  --   HGB 13.0  11.2*  HCT 39.0 33.8*  MCV 95.8 97.7  PLT 121* 113*   Cardiac Enzymes:   No results for input(s): CKTOTAL, CKMB, CKMBINDEX, TROPONINI in the last 168 hours. BNP (last 3 results) No results for input(s): BNP in the last 8760 hours.  ProBNP (last 3 results) No results for input(s): PROBNP in the last 8760 hours.  CBG: No results for input(s): GLUCAP in the last 168 hours.  Recent Results (from the past 240 hour(s))  Urine culture      Status: None   Collection Time: 10/10/15 11:20 AM  Result Value Ref Range Status   Specimen Description URINE, CLEAN CATCH  Final   Special Requests NONE  Final   Culture   Final    MULTIPLE SPECIES PRESENT, SUGGEST RECOLLECTION Performed at [facility]    Report Status 10/11/2015 FINAL  Final  Culture, expectorated sputum-assessment     Status: None   Collection Time: 10/11/15  4:41 PM  Result Value Ref Range Status   Specimen Description SPUTUM  Final   Special Requests NONE  Final   Sputum evaluation   Final    THIS SPECIMEN IS ACCEPTABLE. RESPIRATORY CULTURE REPORT TO FOLLOW.   Report Status 10/11/2015 FINAL  Final  MRSA PCR Screening     Status: None   Collection Time: 10/11/15  6:22 PM  Result Value Ref Range Status   MRSA by PCR NEGATIVE NEGATIVE Final    Comment:        The GeneXpert MRSA Assay (FDA approved for NASAL specimens only), is one component of a comprehensive MRSA colonization surveillance program. It is not intended to diagnose MRSA infection nor to guide or monitor treatment for MRSA infections.      Studies: No results found.  Scheduled Meds: . antiseptic oral rinse  7 mL Mouth Rinse q12n4p  . calcium carbonate  2 tablet Oral Daily  . chlorhexidine  15 mL Mouth Rinse BID  . chlorthalidone  12.5 mg Oral Daily  . cholecalciferol  1,000 Units Oral Daily  . doxazosin  2 mg Oral QHS  . enoxaparin (LOVENOX) injection  40 mg Subcutaneous Q24H  . guaiFENesin  5 mL Per Tube TID  . hydrALAZINE  25 mg Oral TID  . ISOSOURCE 1.5 CAL  250 mL Oral 4 times per day   And  . ISOSOURCE 1.5 CAL  125 mL Oral Q24H  . levothyroxine  75 mcg Oral QAC breakfast  . losartan  100 mg Oral Daily  . magnesium sulfate 1 - 4 g bolus IVPB  2 g Intravenous Once  . metoCLOPramide (REGLAN) injection  5 mg Intravenous 3 times per day  . pantoprazole sodium  40 mg Per Tube Daily  . potassium chloride  40 mEq Per Tube Once  . pyridostigmine  60 mg Oral 4 times per day   . rOPINIRole  0.5 mg Oral QHS    Continuous Infusions: . sodium chloride 75 mL/hr at 10/12/15 [id number]     Time spent: 55mins  Deigo Alonso MD, PhD  [facility] Pager [phone]. If 7PM-7AM, please contact night-coverage at [email or web address], password [facility] 10/12/2015, 8:18 AM  LOS: 1 day

## 2015-10-13 ENCOUNTER — Inpatient Hospital Stay (HOSPITAL_COMMUNITY): Payer: Medicare Other

## 2015-10-13 LAB — CBC
HCT: 33 % — ABNORMAL LOW (ref 36.0–46.0)
HEMOGLOBIN: 10.8 g/dL — AB (ref 12.0–15.0)
MCH: 32 pg (ref 26.0–34.0)
MCHC: 32.7 g/dL (ref 30.0–36.0)
MCV: 97.6 fL (ref 78.0–100.0)
Platelets: 123 10*3/uL — ABNORMAL LOW (ref 150–400)
RBC: 3.38 MIL/uL — AB (ref 3.87–5.11)
RDW: 13.6 % (ref 11.5–15.5)
WBC: 3.3 10*3/uL — ABNORMAL LOW (ref 4.0–10.5)

## 2015-10-13 LAB — BASIC METABOLIC PANEL
Anion gap: 7 (ref 5–15)
BUN: 19 mg/dL (ref 6–20)
CHLORIDE: 99 mmol/L — AB (ref 101–111)
CO2: 31 mmol/L (ref 22–32)
Calcium: 8.5 mg/dL — ABNORMAL LOW (ref 8.9–10.3)
Creatinine, Ser: 0.47 mg/dL (ref 0.44–1.00)
GFR calc Af Amer: >60 mL/min (ref >60)
GFR calc non Af Amer: >60 mL/min (ref >60)
Glucose, Bld: 96 mg/dL (ref 65–99)
POTASSIUM: 3.3 mmol/L — AB (ref 3.5–5.1)
SODIUM: 137 mmol/L (ref 135–145)

## 2015-10-13 LAB — PHOSPHORUS: Phosphorus: 2 mg/dL — ABNORMAL LOW (ref 2.5–4.6)

## 2015-10-13 LAB — MAGNESIUM: MAGNESIUM: 1.8 mg/dL (ref 1.7–2.4)

## 2015-10-13 MED ORDER — POTASSIUM CHLORIDE 20 MEQ/15ML (10%) PO SOLN
40.0000 meq | Freq: Once | ORAL | Status: AC
  Administered 2015-10-13: 40 meq
  Filled 2015-10-13: qty 30

## 2015-10-13 MED ORDER — LEVALBUTEROL HCL 0.63 MG/3ML IN NEBU
0.6300 mg | INHALATION_SOLUTION | Freq: Four times a day (QID) | RESPIRATORY_TRACT | Status: DC | PRN
  Administered 2015-10-13: 0.63 mg via RESPIRATORY_TRACT
  Filled 2015-10-13: qty 3

## 2015-10-13 MED ORDER — MAGNESIUM SULFATE 2 GM/50ML IV SOLN
2.0000 g | Freq: Once | INTRAVENOUS | Status: AC
  Administered 2015-10-13: 2 g via INTRAVENOUS
  Filled 2015-10-13: qty 50

## 2015-10-13 MED ORDER — SALINE SPRAY 0.65 % NA SOLN
1.0000 | Freq: Two times a day (BID) | NASAL | Status: DC
  Administered 2015-10-13 – 2015-10-14 (×2): 1 via NASAL
  Filled 2015-10-13: qty 44

## 2015-10-13 MED ORDER — OXYMETAZOLINE HCL 0.05 % NA SOLN
1.0000 | Freq: Once | NASAL | Status: DC
  Filled 2015-10-13: qty 15

## 2015-10-13 NOTE — Progress Notes (Signed)
PT refused CPT- RT will attempt to schedule CPT 2 hours post feeding to avoid discomfort to PT.

## 2015-10-13 NOTE — Progress Notes (Signed)
PROGRESS NOTE  Maria Vincent [id number] DOB: [birth year] DOA: 10/10/2015 PCP: Thomes Dinning, MD  HPI/Recap of past 24 hours:  Feeling better, less productive cough, on 2liter oxygen, not in distress, able to ambulate No n/v, had mild nose bleed which stopped spontaneuosly  Assessment/Plan: Principal Problem:   Acute on chronic respiratory failure (HCC) Active Problems:   Tonsillar cancer (HCC)   Hypoxia   S/P percutaneous endoscopic gastrostomy (PEG) tube placement (HCC)   Nausea and vomiting   Chronic interstitial lung disease ([place])   Myasthenia gravis ([place])   Hypothyroidism   Essential hypertension  Acute on chronic respiratory failure ([place]) with hypoxia/history of COPD/chronic interstitial lung disease - Patient uses 2 L of oxygen at home at bedtime, but was hypoxic on arrival. 86% - Maintain supplemental oxygen. - 1/28Suspect aspiration pneumonitis given recent nausea and vomiting. Patient does has h/o bronchiectasis, ? Flare up of bronchiectasis?, start chest vest, flutter valve, mucinex, nebs, currently no significant wheezing on exam. -1/29 better, less cough, lung exam has improved, continue currently regimen, continue chest PT. -sputum culture g-rods, final result pending   Nausea and vomiting - Seems to be self-limited. Zofran as needed.  -start reglan, better, may need to discharge on reglan  Hypokalemia, hypomagnesemia: replace k and mag, continue to replace, d/c hctz. Repeat labs in am   Myasthenia gravis ([place]) - Continue Mestinon. Able to ambulate, has fair cough effort.  Tonsillar cancer ([place]) status post percutaneous endoscopic gastrostomy tube placement - Treated with radiation therapy. baseline hoarseness. - Continue tube feeds. Nothing by mouth.    Hypothyroidism - Acquired secondary to history of radiation therapy. Continue Synthroid.   Essential hypertension - Continue Cozaar and hydralazine. D/c hctz due to prone to  dehydartion -not well controlled, continue titrate meds  Nose bleed, minor, spontaneously stopped, nasal afrin and saline spray, hold lovenox today on 1/30   DVT prophylaxis - Lovenox ordered.  Code Status: Full. Confirmed with the patient. Family Communication: Son, [name] is emergency contact (not present), is also her POA. Disposition Plan: Home likely 1/31, sputum culture pending   Consultants:  none  Procedures:  none  Antibiotics:  none   Objective: BP 149/92 mmHg  Pulse 83  Temp(Src) 98 F (36.7 C) (Oral)  Resp 16  Ht 5\' 5"  (1.651 m)  Wt 58.3 kg (128 lb 8.5 oz)  BMI 21.39 kg/m2  SpO2 98%  Intake/Output Summary (Last 24 hours) at 10/13/15 0854 Last data filed at 10/13/15 0656  Gross per 24 hour  Intake 3042.5 ml  Output      0 ml  Net 3042.5 ml   Filed Weights   10/10/15 0918 10/10/15 1518  Weight: 56.7 kg (125 lb) 58.3 kg (128 lb 8.5 oz)    Exam:   General:  NAD, no coughing spells observed today, able to ambulate  Cardiovascular: RRR  Respiratory: less crackles, no significant wheezing, improved areation.   Abdomen: Soft/ND/NT, positive BS, peg tube in place  Musculoskeletal: No Edema  Neuro: aaox3  Data Reviewed: Basic Metabolic Panel:  Recent Labs Lab 10/10/15 0940 10/12/15 0420 10/13/15 0454  NA 133* 136 137  K 3.7 3.1* 3.3*  CL 93* 97* 99*  CO2 27 29 31   GLUCOSE 104* 100* 96  BUN 33* 29* 19  CREATININE 1.07* 0.65 0.47  CALCIUM 9.6 8.8* 8.5*  MG  --  1.6* 1.8  PHOS  --   --  2.0*   Liver Function Tests:  Recent Labs Lab 10/10/15 0940  AST  27  ALT 15  ALKPHOS 71  BILITOT 1.1  PROT 7.5  ALBUMIN 4.5   No results for input(s): LIPASE, AMYLASE in the last 168 hours. No results for input(s): AMMONIA in the last 168 hours. CBC:  Recent Labs Lab 10/10/15 0940 10/12/15 0420 10/13/15 0454  WBC 5.7 3.6* 3.3*  NEUTROABS 5.1  --   --   HGB 13.0 11.2* 10.8*  HCT 39.0 33.8* 33.0*  MCV 95.8 97.7 97.6  PLT 121*  113* 123*   Cardiac Enzymes:   No results for input(s): CKTOTAL, CKMB, CKMBINDEX, TROPONINI in the last 168 hours. BNP (last 3 results) No results for input(s): BNP in the last 8760 hours.  ProBNP (last 3 results) No results for input(s): PROBNP in the last 8760 hours.  CBG: No results for input(s): GLUCAP in the last 168 hours.  Recent Results (from the past 240 hour(s))  Urine culture     Status: None   Collection Time: 10/10/15 11:20 AM  Result Value Ref Range Status   Specimen Description URINE, CLEAN CATCH  Final   Special Requests NONE  Final   Culture   Final    MULTIPLE SPECIES PRESENT, SUGGEST RECOLLECTION Performed at [facility]    Report Status 10/11/2015 FINAL  Final  Culture, respiratory (NON-Expectorated)     Status: None (Preliminary result)   Collection Time: 10/11/15  4:40 PM  Result Value Ref Range Status   Specimen Description SPUTUM  Final   Special Requests NONE  Final   Gram Stain   Final    ABUNDANT WBC PRESENT,BOTH PMN AND MONONUCLEAR FEW SQUAMOUS EPITHELIAL CELLS PRESENT ABUNDANT GRAM POSITIVE COCCI IN CHAINS FEW GRAM POSITIVE RODS FEW GRAM NEGATIVE RODS Performed at [facility]    Culture   Final    Culture reincubated for better growth Performed at [facility]    Report Status PENDING  Incomplete  Culture, expectorated sputum-assessment     Status: None   Collection Time: 10/11/15  4:41 PM  Result Value Ref Range Status   Specimen Description SPUTUM  Final   Special Requests NONE  Final   Sputum evaluation   Final    THIS SPECIMEN IS ACCEPTABLE. RESPIRATORY CULTURE REPORT TO FOLLOW.   Report Status 10/11/2015 FINAL  Final  MRSA PCR Screening     Status: None   Collection Time: 10/11/15  6:22 PM  Result Value Ref Range Status   MRSA by PCR NEGATIVE NEGATIVE Final    Comment:        The GeneXpert MRSA Assay (FDA approved for NASAL specimens only), is one component of a comprehensive MRSA  colonization surveillance program. It is not intended to diagnose MRSA infection nor to guide or monitor treatment for MRSA infections.      Studies: No results found.  Scheduled Meds: . antiseptic oral rinse  7 mL Mouth Rinse q12n4p  . calcium carbonate  2 tablet Oral Daily  . chlorhexidine  15 mL Mouth Rinse BID  . cholecalciferol  1,000 Units Oral Daily  . doxazosin  2 mg Oral QHS  . enoxaparin (LOVENOX) injection  40 mg Subcutaneous Q24H  . guaiFENesin  5 mL Per Tube TID  . hydrALAZINE  25 mg Oral TID  . ISOSOURCE 1.5 CAL  250 mL Oral 4 times per day   And  . ISOSOURCE 1.5 CAL  125 mL Oral Q24H  . levothyroxine  75 mcg Oral QAC breakfast  . losartan  100 mg Oral Daily  .  magnesium sulfate 1 - 4 g bolus IVPB  2 g Intravenous Once  . metoCLOPramide (REGLAN) injection  5 mg Intravenous 3 times per day  . pantoprazole sodium  40 mg Per Tube Daily  . potassium chloride  40 mEq Per Tube Once  . pyridostigmine  60 mg Oral 4 times per day  . rOPINIRole  0.5 mg Oral QHS    Continuous Infusions:     Time spent: 42mins  Woodfin Kiss MD, PhD  [facility] Pager [phone]. If 7PM-7AM, please contact night-coverage at [email or web address], password [facility] 10/13/2015, 8:54 AM  LOS: 2 days

## 2015-10-13 NOTE — Progress Notes (Signed)
Pt refused CPT at this time.  Pt states "she had just eaten and she was fine for the evening."   RT will continue to monitor as needed.

## 2015-10-14 ENCOUNTER — Inpatient Hospital Stay (HOSPITAL_COMMUNITY): Payer: Medicare Other

## 2015-10-14 DIAGNOSIS — I509 Heart failure, unspecified: Secondary | ICD-10-CM

## 2015-10-14 DIAGNOSIS — J471 Bronchiectasis with (acute) exacerbation: Secondary | ICD-10-CM | POA: Insufficient documentation

## 2015-10-14 LAB — PHOSPHORUS: PHOSPHORUS: 2.5 mg/dL (ref 2.5–4.6)

## 2015-10-14 LAB — CBC
HCT: 35.6 % — ABNORMAL LOW (ref 36.0–46.0)
Hemoglobin: 11.9 g/dL — ABNORMAL LOW (ref 12.0–15.0)
MCH: 32.5 pg (ref 26.0–34.0)
MCHC: 33.4 g/dL (ref 30.0–36.0)
MCV: 97.3 fL (ref 78.0–100.0)
PLATELETS: 132 10*3/uL — AB (ref 150–400)
RBC: 3.66 MIL/uL — ABNORMAL LOW (ref 3.87–5.11)
RDW: 13.7 % (ref 11.5–15.5)
WBC: 5.3 10*3/uL (ref 4.0–10.5)

## 2015-10-14 LAB — BASIC METABOLIC PANEL
Anion gap: 11 (ref 5–15)
BUN: 27 mg/dL — ABNORMAL HIGH (ref 6–20)
CALCIUM: 9.1 mg/dL (ref 8.9–10.3)
CO2: 27 mmol/L (ref 22–32)
CREATININE: 0.62 mg/dL (ref 0.44–1.00)
Chloride: 96 mmol/L — ABNORMAL LOW (ref 101–111)
Glucose, Bld: 140 mg/dL — ABNORMAL HIGH (ref 65–99)
Potassium: 3.5 mmol/L (ref 3.5–5.1)
SODIUM: 134 mmol/L — AB (ref 135–145)

## 2015-10-14 LAB — CULTURE, RESPIRATORY

## 2015-10-14 LAB — CULTURE, RESPIRATORY W GRAM STAIN

## 2015-10-14 LAB — MAGNESIUM: Magnesium: 1.7 mg/dL (ref 1.7–2.4)

## 2015-10-14 MED ORDER — SODIUM CHLORIDE 0.9 % IV SOLN
250.0000 mg | Freq: Four times a day (QID) | INTRAVENOUS | Status: DC
  Administered 2015-10-14: 250 mg via INTRAVENOUS
  Filled 2015-10-14 (×2): qty 250

## 2015-10-14 MED ORDER — FUROSEMIDE 10 MG/ML IJ SOLN: 20.0000 mg | Freq: Once | INTRAMUSCULAR | Status: DC

## 2015-10-14 MED ORDER — LEVOFLOXACIN IN D5W 750 MG/150ML IV SOLN
750.0000 mg | Freq: Every day | INTRAVENOUS | Status: DC
  Administered 2015-10-14 – 2015-10-15 (×2): 750 mg via INTRAVENOUS
  Filled 2015-10-14 (×2): qty 150

## 2015-10-14 MED ORDER — FUROSEMIDE 10 MG/ML IJ SOLN
40.0000 mg | Freq: Once | INTRAMUSCULAR | Status: AC
  Administered 2015-10-14: 40 mg via INTRAVENOUS
  Filled 2015-10-14: qty 4

## 2015-10-14 NOTE — Progress Notes (Signed)
PROGRESS NOTE  Maria Vincent [id number] DOB: [birth year] DOA: 10/10/2015 PCP: Thomes Dinning, MD  HPI/Recap of past 24 hours:  Patient had coughing spells and vomited last night, spiked fever 101.4, planned discharge cancelled  Assessment/Plan: Principal Problem:   Acute on chronic respiratory failure ([place]) Active Problems:   Tonsillar cancer ([place])   Hypoxia   S/P percutaneous endoscopic gastrostomy (PEG) tube placement (HCC)   Nausea and vomiting   Chronic interstitial lung disease ([place])   Myasthenia gravis ([place])   Hypothyroidism   Essential hypertension  Acute on chronic respiratory failure ([place]) with hypoxia/history of COPD/chronic interstitial lung disease - Patient uses 2 L of oxygen at home at bedtime, but was hypoxic on arrival. 86%, but cxr on presentation unremarkable, patient does not have fever, no leukocytosis, no abx from admission - 1/28Suspect aspiration pneumonitis given recent nausea and vomiting. Patient does has h/o bronchiectasis, ? Flare up of bronchiectasis?, start chest vest, flutter valve, mucinex, nebs, currently no significant wheezing on exam. -1/29 better, less cough, lung exam has improved, continue currently regimen, continue chest PT.  -1/30 continue to feel better, cough less, Ambulating, want to go home,  -1/31- planned discharge cancelled, patient  Developed fever last night possible aspirated after coughing spells, started on imipenem 1/30 night, abx changed to levaquin according to sputum culture.    Nausea and vomiting - Seems to be self-limited. Zofran as needed.  -start reglan,  may need to discharge on reglan -suspect coughing spells caused n/v.  Hypokalemia, hypomagnesemia: replace k and mag, continue to replace, d/c hctz. Repeat labs in am   Myasthenia gravis ([place]) - Continue Mestinon. Able to ambulate, has fair cough effort.  Tonsillar cancer ([place]) status post percutaneous endoscopic gastrostomy tube placement - Treated  with radiation therapy. baseline hoarseness. - Continue tube feeds. Nothing by mouth.    Hypothyroidism - Acquired secondary to history of radiation therapy. Continue Synthroid.   Essential hypertension - Continue Cozaar and hydralazine. D/c hctz due to prone to dehydartion -not well controlled, continue titrate meds  Epistaxis on 1/30, minor, spontaneously stopped, nasal afrin and saline spray, lovenox held on 1/30   DVT prophylaxis - Lovenox ordered.  Code Status: Full. Confirmed with the patient. Family Communication: Son, [name] is emergency contact (not present), is also her POA. Disposition Plan: spiking fever, discharge cancelled on 1/31   Consultants:  none  Procedures:  none  Antibiotics:  Imipenem x1 on 1/30 night  levaquin from 1/31   Objective: BP 121/73 mmHg  Pulse 112  Temp(Src) 99.3 F (37.4 C) (Oral)  Resp 18  Ht 5\' 5"  (1.651 m)  Wt 58.3 kg (128 lb 8.5 oz)  BMI 21.39 kg/m2  SpO2 90%  Intake/Output Summary (Last 24 hours) at 10/14/15 0951 Last data filed at 10/14/15 0600  Gross per 24 hour  Intake    100 ml  Output    250 ml  Net   -150 ml   Filed Weights   10/10/15 0918 10/10/15 1518  Weight: 56.7 kg (125 lb) 58.3 kg (128 lb 8.5 oz)    Exam:   General:  NAD,   Cardiovascular: RRR  Respiratory: less crackles, no significant wheezing, improved areation.   Abdomen: Soft/ND/NT, positive BS, peg tube in place  Musculoskeletal: No Edema  Neuro: aaox3  Data Reviewed: Basic Metabolic Panel:  Recent Labs Lab 10/10/15 0940 10/12/15 0420 10/13/15 0454 10/14/15 0405  NA 133* 136 137 134*  K 3.7 3.1* 3.3* 3.5  CL 93* 97* 99* 96*  CO2 27 29 31 27   GLUCOSE 104* 100* 96 140*  BUN 33* 29* 19 27*  CREATININE 1.07* 0.65 0.47 0.62  CALCIUM 9.6 8.8* 8.5* 9.1  MG  --  1.6* 1.8 1.7  PHOS  --   --  2.0* 2.5   Liver Function Tests:  Recent Labs Lab 10/10/15 0940  AST 27  ALT 15  ALKPHOS 71  BILITOT 1.1  PROT 7.5    ALBUMIN 4.5   No results for input(s): LIPASE, AMYLASE in the last 168 hours. No results for input(s): AMMONIA in the last 168 hours. CBC:  Recent Labs Lab 10/10/15 0940 10/12/15 0420 10/13/15 0454 10/14/15 0405  WBC 5.7 3.6* 3.3* 5.3  NEUTROABS 5.1  --   --   --   HGB 13.0 11.2* 10.8* 11.9*  HCT 39.0 33.8* 33.0* 35.6*  MCV 95.8 97.7 97.6 97.3  PLT 121* 113* 123* 132*   Cardiac Enzymes:   No results for input(s): CKTOTAL, CKMB, CKMBINDEX, TROPONINI in the last 168 hours. BNP (last 3 results) No results for input(s): BNP in the last 8760 hours.  ProBNP (last 3 results) No results for input(s): PROBNP in the last 8760 hours.  CBG: No results for input(s): GLUCAP in the last 168 hours.  Recent Results (from the past 240 hour(s))  Urine culture     Status: None   Collection Time: 10/10/15 11:20 AM  Result Value Ref Range Status   Specimen Description URINE, CLEAN CATCH  Final   Special Requests NONE  Final   Culture   Final    MULTIPLE SPECIES PRESENT, SUGGEST RECOLLECTION Performed at [facility]    Report Status 10/11/2015 FINAL  Final  Culture, respiratory (NON-Expectorated)     Status: None   Collection Time: 10/11/15  4:40 PM  Result Value Ref Range Status   Specimen Description SPUTUM  Final   Special Requests NONE  Final   Gram Stain   Final    ABUNDANT WBC PRESENT,BOTH PMN AND MONONUCLEAR FEW SQUAMOUS EPITHELIAL CELLS PRESENT ABUNDANT GRAM POSITIVE COCCI IN CHAINS FEW GRAM POSITIVE RODS FEW GRAM NEGATIVE RODS Performed at [facility]    Culture   Final    MODERATE ENTEROBACTER AEROGENES ABUNDANT GROUP B STREP(S.AGALACTIAE)ISOLATED Note: Beta hemolytic streptococci are predictably susceptible to penicillin and other beta lactams. Susceptibility testing not routinely performed. Performed at [facility]    Report Status 10/14/2015 FINAL  Final   Organism ID, Bacteria ENTEROBACTER AEROGENES  Final      Susceptibility    Enterobacter aerogenes - MIC*    CEFAZOLIN >=64 RESISTANT Resistant     CEFEPIME <=1 SENSITIVE Sensitive     CEFTAZIDIME <=1 SENSITIVE Sensitive     CEFTRIAXONE <=1 SENSITIVE Sensitive     CIPROFLOXACIN <=0.25 SENSITIVE Sensitive     GENTAMICIN <=1 SENSITIVE Sensitive     IMIPENEM 2 INTERMEDIATE Intermediate     PIP/TAZO <=4 SENSITIVE Sensitive     TOBRAMYCIN <=1 SENSITIVE Sensitive     TRIMETH/SULFA Value in next row Sensitive      <=20 SENSITIVE(NOTE)    * MODERATE ENTEROBACTER AEROGENES  Culture, expectorated sputum-assessment     Status: None   Collection Time: 10/11/15  4:41 PM  Result Value Ref Range Status   Specimen Description SPUTUM  Final   Special Requests NONE  Final   Sputum evaluation   Final    THIS SPECIMEN IS ACCEPTABLE. RESPIRATORY CULTURE REPORT TO FOLLOW.   Report Status 10/11/2015 FINAL  Final  MRSA PCR Screening     Status: None   Collection Time: 10/11/15  6:22 PM  Result Value Ref Range Status   MRSA by PCR NEGATIVE NEGATIVE Final    Comment:        The GeneXpert MRSA Assay (FDA approved for NASAL specimens only), is one component of a comprehensive MRSA colonization surveillance program. It is not intended to diagnose MRSA infection nor to guide or monitor treatment for MRSA infections.   Culture, blood (routine x 2)     Status: None (Preliminary result)   Collection Time: 10/14/15  4:15 AM  Result Value Ref Range Status   Specimen Description BLOOD RIGHT HAND  Final   Special Requests BOTTLES DRAWN AEROBIC AND ANAEROBIC 5CC  Final   Culture PENDING  Incomplete   Report Status PENDING  Incomplete     Studies: Dg Chest Port 1 View  10/14/2015  CLINICAL DATA:  Increased oxygen demand. EXAM: PORTABLE CHEST 1 VIEW COMPARISON:  Radiograph 10/10/2015, 10/02/2015. Most recent chest CT 03/20/2015 FINDINGS: Cardiomegaly is unchanged. Progressive interstitial prominence from prior exam, with vascular congestion. Right infrahilar opacities suggestive of  atelectasis. No large pleural effusion. No pneumothorax. Unchanged right proximal humerus lesion, likely enchondroma or bone infarct. IMPRESSION: 1. Development of vascular congestion with progressive interstitial prominence from prior, may reflect mild edema or increased interstitial lung disease. Cardiomegaly is unchanged. 2. Right infrahilar opacity, suggestive of atelectasis. Electronically Signed   By: Jeb Levering M.D.   On: 10/14/2015 00:33    Scheduled Meds: . antiseptic oral rinse  7 mL Mouth Rinse q12n4p  . calcium carbonate  2 tablet Oral Daily  . chlorhexidine  15 mL Mouth Rinse BID  . cholecalciferol  1,000 Units Oral Daily  . doxazosin  2 mg Oral QHS  . enoxaparin (LOVENOX) injection  40 mg Subcutaneous Q24H  . guaiFENesin  5 mL Per Tube TID  . hydrALAZINE  25 mg Oral TID  . ISOSOURCE 1.5 CAL  250 mL Oral 4 times per day   And  . ISOSOURCE 1.5 CAL  125 mL Oral Q24H  . levothyroxine  75 mcg Oral QAC breakfast  . losartan  100 mg Oral Daily  . metoCLOPramide (REGLAN) injection  5 mg Intravenous 3 times per day  . oxymetazoline  1 spray Each Nare Once  . pantoprazole sodium  40 mg Per Tube Daily  . pyridostigmine  60 mg Oral 4 times per day  . rOPINIRole  0.5 mg Oral QHS  . sodium chloride  1 spray Each Nare BID    Continuous Infusions:     Time spent: 43mins  Esbeidy Mclaine MD, PhD  [facility] Pager [phone]. If 7PM-7AM, please contact night-coverage at [email or web address], password [facility] 10/14/2015, 9:51 AM  LOS: 3 days

## 2015-10-14 NOTE — Care Management Important Message (Signed)
Important Message  Patient Details  Name: LAKYNN BRIETZKE MRN: [id number] Date of Birth: December 16, 1945   Medicare Important Message Given:  Yes    Camillo Flaming 10/14/2015, 9:08 AMImportant Message  Patient Details  Name: MEYGAN MCDIVITT MRN: [id number] Date of Birth: 09-Dec-1945   Medicare Important Message Given:  Yes    Camillo Flaming 10/14/2015, 9:08 AM

## 2015-10-14 NOTE — Progress Notes (Signed)
Pharmacy Antibiotic Note  Maria Vincent is a 70 y.o. female admitted on 10/10/2015 with suspected aspiration pneumonia.  Pharmacy has been consulted for Primaxin dosing.  Today, 10/14/2015: Temp: 101.4 overnight, previously afebrile WBC: remain wnl Renal: SCr returned to baseline; CrCl 61 CG  Plan: Day 1 abx  Enterobacter with intermed resistance to Primaxin on Sput Cx  After discussion with Dr. Erlinda Hong, antibiotics will be changed to Levaquin 750 mg IV q24 hr based on sputum culture results. (and allergies)  Height: 5\' 5"  (165.1 cm) Weight: 128 lb 8.5 oz (58.3 kg) IBW/kg (Calculated) : 57  Temp (24hrs), Avg:99.2 F (37.3 C), Min:97.9 F (36.6 C), Max:101.4 F (38.6 C)   Recent Labs Lab 10/10/15 0940 10/12/15 0420 10/13/15 0454 10/14/15 0405  WBC 5.7 3.6* 3.3* 5.3  CREATININE 1.07* 0.65 0.47 0.62    Estimated Creatinine Clearance: 61.4 mL/min (by C-G formula based on Cr of 0.62).    Allergies  Allergen Reactions  . Cephalosporins Anaphylaxis    Tongue swelling  . Phenergan [Promethazine Hcl] Other (See Comments)    Extreme dopiness, dizziness, and leg agitation.  . Albuterol Other (See Comments)    Left arm numbness, strong headache  . Ambien [Zolpidem Tartrate] Other (See Comments)    Nightmares   . Clonidine Derivatives Other (See Comments)    Patch burns skin  . Demerol [Meperidine] Nausea And Vomiting  . Hctz [Hydrochlorothiazide] Other (See Comments)    Blood pressure drops  . Novocain [Procaine] Other (See Comments)    fainting  . Penicillins Hives  . Sulfur Swelling  . Bacitracin Rash  . Biaxin [Clarithromycin] Rash  . Merthiolate [Thimerosal] Rash  . Neosporin [Neomycin-Bacitracin Zn-Polymyx] Rash  . Polysporin [Bacitracin-Polymyxin B] Rash    Antimicrobials this admission: 1/31 >>primaxin  >> 1/31  Dose adjustments this admission:  Microbiology results: 1/27 urine: multi species, suggest recollect 1/28 sputum: Mod E aerogenes (R-Cefaz,  I-Primaxin); Abundant GBS 1/31 blood: IP MRSA nasal swab: neg   Thank you for allowing pharmacy to be a part of this patient's care.  Reuel Boom, PharmD, BCPS Pager: [phone] 10/14/2015, 10:05 AM

## 2015-10-14 NOTE — Progress Notes (Addendum)
Shift event: Earlier in shift, pt had increased O2 demand and RN asked for neb tx. Then, NP informed that pt had been refusing to wear her chest vest. O2 rate bumped and O2 sats normalized. NP reviewed chart and attending's note.  Later, pt up with assistance and had coughing spell and vomited. T 101.4. Assisted back to bed. O2 sat normal afterward. Blood cultures ordered. CXR earlier showed pulmonary vascular congestion, so will give a dose of Lasix now. Given fever and noted concern for aspiration, will start abx. Pt allergic to PCN and mycin. Start Imipenem.  Bedrest for now. Pt is NPO already. Labs ordered this am.  Patrica Duel, NP [facility]

## 2015-10-14 NOTE — Progress Notes (Signed)
  Echocardiogram 2D Echocardiogram has been performed.  Maria Vincent 10/14/2015, 12:09 PM

## 2015-10-14 NOTE — Progress Notes (Signed)
ANTIBIOTIC CONSULT NOTE - INITIAL  Pharmacy Consult for Primaxin Indication:  Aspiration pneumonia  Allergies  Allergen Reactions  . Cephalosporins Anaphylaxis    Tongue swelling  . Phenergan [Promethazine Hcl] Other (See Comments)    Extreme dopiness, dizziness, and leg agitation.  . Albuterol Other (See Comments)    Left arm numbness, strong headache  . Ambien [Zolpidem Tartrate] Other (See Comments)    Nightmares   . Clonidine Derivatives Other (See Comments)    Patch burns skin  . Demerol [Meperidine] Nausea And Vomiting  . Hctz [Hydrochlorothiazide] Other (See Comments)    Blood pressure drops  . Novocain [Procaine] Other (See Comments)    fainting  . Penicillins Hives  . Sulfur Swelling  . Bacitracin Rash  . Biaxin [Clarithromycin] Rash  . Merthiolate [Thimerosal] Rash  . Neosporin [Neomycin-Bacitracin Zn-Polymyx] Rash  . Polysporin [Bacitracin-Polymyxin B] Rash    Patient Measurements: Height: 5\' 5"  (165.1 cm) Weight: 128 lb 8.5 oz (58.3 kg) IBW/kg (Calculated) : 57   Vital Signs: Temp: 101.4 F (38.6 C) (01/31 0345) Temp Source: Oral (01/31 0345) BP: 148/78 mmHg (01/31 0417) Pulse Rate: 102 (01/31 0417) Intake/Output from previous day:   Intake/Output from this shift:    Labs:  Recent Labs  10/12/15 0420 10/13/15 0454  WBC 3.6* 3.3*  HGB 11.2* 10.8*  PLT 113* 123*  CREATININE 0.65 0.47   Estimated Creatinine Clearance: 61.4 mL/min (by C-G formula based on Cr of 0.47). No results for input(s): VANCOTROUGH, VANCOPEAK, VANCORANDOM, GENTTROUGH, GENTPEAK, GENTRANDOM, TOBRATROUGH, TOBRAPEAK, TOBRARND, AMIKACINPEAK, AMIKACINTROU, AMIKACIN in the last 72 hours.   Microbiology: Recent Results (from the past 720 hour(s))  Urine culture     Status: None   Collection Time: 10/10/15 11:20 AM  Result Value Ref Range Status   Specimen Description URINE, CLEAN CATCH  Final   Special Requests NONE  Final   Culture   Final    MULTIPLE SPECIES PRESENT,  SUGGEST RECOLLECTION Performed at [facility]    Report Status 10/11/2015 FINAL  Final  Culture, respiratory (NON-Expectorated)     Status: None (Preliminary result)   Collection Time: 10/11/15  4:40 PM  Result Value Ref Range Status   Specimen Description SPUTUM  Final   Special Requests NONE  Final   Gram Stain   Final    ABUNDANT WBC PRESENT,BOTH PMN AND MONONUCLEAR FEW SQUAMOUS EPITHELIAL CELLS PRESENT ABUNDANT GRAM POSITIVE COCCI IN CHAINS FEW GRAM POSITIVE RODS FEW GRAM NEGATIVE RODS Performed at [facility]    Culture   Final    MODERATE GRAM NEGATIVE RODS Performed at [facility]    Report Status PENDING  Incomplete  Culture, expectorated sputum-assessment     Status: None   Collection Time: 10/11/15  4:41 PM  Result Value Ref Range Status   Specimen Description SPUTUM  Final   Special Requests NONE  Final   Sputum evaluation   Final    THIS SPECIMEN IS ACCEPTABLE. RESPIRATORY CULTURE REPORT TO FOLLOW.   Report Status 10/11/2015 FINAL  Final  MRSA PCR Screening     Status: None   Collection Time: 10/11/15  6:22 PM  Result Value Ref Range Status   MRSA by PCR NEGATIVE NEGATIVE Final    Comment:        The GeneXpert MRSA Assay (FDA approved for NASAL specimens only), is one component of a comprehensive MRSA colonization surveillance program. It is not intended to diagnose MRSA infection nor to guide or monitor treatment for MRSA infections.  Medical History: Past Medical History  Diagnosis Date  . Myasthenia gravis ([place]) 09/2007    Dr. Lauretta Chester [phone]  . 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 Monica Becton [phone]  . Bronchiectasis ([place])     Dr. Elta Guadeloupe Doner [phone]  . COPD (chronic obstructive pulmonary disease) ([place])   . High blood pressure     Dr. Effie Shy Masdonado [phone] : Extensivist  . GI disease     100 % NPO: G-tube: Dr. Governor Specking [phone]  . Bursitis of elbow   . Hypothyroidism  (acquired)     Medications:  Scheduled:  . antiseptic oral rinse  7 mL Mouth Rinse q12n4p  . calcium carbonate  2 tablet Oral Daily  . chlorhexidine  15 mL Mouth Rinse BID  . cholecalciferol  1,000 Units Oral Daily  . doxazosin  2 mg Oral QHS  . enoxaparin (LOVENOX) injection  40 mg Subcutaneous Q24H  . guaiFENesin  5 mL Per Tube TID  . hydrALAZINE  25 mg Oral TID  . imipenem-cilastatin  250 mg Intravenous 4 times per day  . ISOSOURCE 1.5 CAL  250 mL Oral 4 times per day   And  . ISOSOURCE 1.5 CAL  125 mL Oral Q24H  . levothyroxine  75 mcg Oral QAC breakfast  . losartan  100 mg Oral Daily  . metoCLOPramide (REGLAN) injection  5 mg Intravenous 3 times per day  . oxymetazoline  1 spray Each Nare Once  . pantoprazole sodium  40 mg Per Tube Daily  . pyridostigmine  60 mg Oral 4 times per day  . rOPINIRole  0.5 mg Oral QHS  . sodium chloride  1 spray Each Nare BID   Infusions:   Assessment: 74 yoF with multiple drug allergies with concern for aspiration.  Imipenem per Rx. Goal of Therapy:  Treat PNA  Plan:   Primaxin 250mg  IV q6h  F/u Scr/cultures/levels as needed  Dorrene German 10/14/2015,4:24 AM

## 2015-10-15 ENCOUNTER — Inpatient Hospital Stay (HOSPITAL_COMMUNITY): Payer: Medicare Other

## 2015-10-15 DIAGNOSIS — E039 Hypothyroidism, unspecified: Secondary | ICD-10-CM

## 2015-10-15 DIAGNOSIS — J9621 Acute and chronic respiratory failure with hypoxia: Secondary | ICD-10-CM

## 2015-10-15 DIAGNOSIS — R112 Nausea with vomiting, unspecified: Secondary | ICD-10-CM

## 2015-10-15 DIAGNOSIS — J849 Interstitial pulmonary disease, unspecified: Secondary | ICD-10-CM

## 2015-10-15 DIAGNOSIS — I10 Essential (primary) hypertension: Secondary | ICD-10-CM

## 2015-10-15 LAB — BASIC METABOLIC PANEL
Anion gap: 9 (ref 5–15)
BUN: 28 mg/dL — ABNORMAL HIGH (ref 6–20)
CHLORIDE: 94 mmol/L — AB (ref 101–111)
CO2: 29 mmol/L (ref 22–32)
CREATININE: 0.66 mg/dL (ref 0.44–1.00)
Calcium: 8.5 mg/dL — ABNORMAL LOW (ref 8.9–10.3)
GFR calc non Af Amer: >60 mL/min (ref >60)
Glucose, Bld: 97 mg/dL (ref 65–99)
Potassium: 3.3 mmol/L — ABNORMAL LOW (ref 3.5–5.1)
Sodium: 132 mmol/L — ABNORMAL LOW (ref 135–145)

## 2015-10-15 LAB — CBC
HEMATOCRIT: 34.7 % — AB (ref 36.0–46.0)
HEMOGLOBIN: 11.7 g/dL — AB (ref 12.0–15.0)
MCH: 32.6 pg (ref 26.0–34.0)
MCHC: 33.7 g/dL (ref 30.0–36.0)
MCV: 96.7 fL (ref 78.0–100.0)
Platelets: 134 10*3/uL — ABNORMAL LOW (ref 150–400)
RBC: 3.59 MIL/uL — ABNORMAL LOW (ref 3.87–5.11)
RDW: 13.5 % (ref 11.5–15.5)
WBC: 4.2 10*3/uL (ref 4.0–10.5)

## 2015-10-15 LAB — PHOSPHORUS: PHOSPHORUS: 3.3 mg/dL (ref 2.5–4.6)

## 2015-10-15 LAB — MAGNESIUM: Magnesium: 1.7 mg/dL (ref 1.7–2.4)

## 2015-10-15 MED ORDER — FUROSEMIDE 10 MG/ML IJ SOLN
40.0000 mg | Freq: Once | INTRAMUSCULAR | Status: AC
  Administered 2015-10-15: 40 mg via INTRAVENOUS
  Filled 2015-10-15: qty 4

## 2015-10-15 MED ORDER — LEVOFLOXACIN 750 MG PO TABS: 750.0000 mg | ORAL_TABLET | Freq: Every day | ORAL | Status: AC

## 2015-10-15 MED ORDER — GUAIFENESIN ER 600 MG PO TB12: 600.0000 mg | ORAL_TABLET | Freq: Two times a day (BID) | ORAL | Status: AC

## 2015-10-15 NOTE — Care Management Note (Signed)
Case Management Note  Patient Details  Name: Maria Vincent MRN: [id number] Date of Birth: September 03, 1946  Subjective/Objective:          70 yo admitted with Acute on chronic respiratory failure.          Action/Plan: From home with Son. Pt has home 02 at night through [facility]. Pt has suction at home as well and receives her Tube Feedings from her pharmacy. Pt is currently on 3L of 02 continuously in the hospital. Pt could potentially benefit from a desaturation screen in order to plan for potential change in home 02 needs.  Expected Discharge Date:   (UNKNOWN)               Expected Discharge Plan:  Home/Self Care  In-House Referral:     Discharge planning Services  CM Consult  Post Acute Care Choice:    Choice offered to:     DME Arranged:    DME Agency:     HH Arranged:    HH Agency:     Status of Service:  In process, will continue to follow  Medicare Important Message Given:  Yes Date Medicare IM Given:    Medicare IM give by:    Date Additional Medicare IM Given:    Additional Medicare Important Message give by:     If discussed at [place] of Stay Meetings, dates discussed:    Additional Comments:  Lynnell Catalan, RN 10/15/2015, 10:50 AM

## 2015-10-15 NOTE — Progress Notes (Signed)
SATURATION QUALIFICATIONS: (This note is used to comply with regulatory documentation for home oxygen)  Patient Saturations on Room Air at Rest  92%  Patient Saturations on Room Air while Ambulating = 82%  Patient Saturations on 1 Liters of oxygen while Ambulating = 92%  Please briefly explain why patient needs home oxygen: desaturation with ambulation.  Lorrene Reid

## 2015-10-15 NOTE — Discharge Summary (Addendum)
Maria Vincent, is a 70 y.o. female  DOB [birth year]  MRN [id number].  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 Myasthenia gravis ([place]) 09/2007    Dr. Lauretta Chester [phone]  . 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 Monica Becton [phone]  . Bronchiectasis ([place])     Dr. Elta Guadeloupe Doner [phone]  . COPD (chronic obstructive pulmonary disease) ([place])   . High blood pressure     Dr. Effie Shy Masdonado [phone] : Extensivist  . GI disease     100 % NPO: G-tube: Dr. Governor Specking [phone]  . Bursitis of elbow   . Hypothyroidism (acquired)     Past Surgical History  Procedure Laterality Date  . Gastrostomy tube placement  08/28/2007  . Rectocele repair  08/2005  . Foot surgery    . Abdominal hysterectomy  02/2006  . Appendectomy  10/1960       History of present illness and  Hospital Course:     Kindly see H&P for history of present illness and admission details, please review complete Labs, Consult reports and Test reports  for all details in brief  HPI  from the history and physical done on the day of admission 10/10/2015 Maria Vincent is an 70 y.o. female with a PMH of myasthenia gravis, COPD, bronchiectasis, tonsillar cancer s/p XRT and PEG/tube with history of aspiration pneumonia who presented to [facility] with a chief complaint of a three-day history of reflux of tube feedings followed by severe vomiting, chest pain and shortness of breath. Over the past 24 hours, she was able to tolerate small feedings, water and her medications, but has not resumed her usual schedule of tube feedings. She is concerned that she may have recurrent aspiration pneumonia due to the congestion in her chest. Endorses some shortness of breath, but has chronic shortness of breath due to her COPD. She has a cough productive of green mucous.   Hospital Course   Acute on chronic respiratory failure [facility]) with hypoxia/history of COPD/chronic interstitial lung disease - Patient uses 2  L of oxygen at home at bedtime, but was hypoxic on arrival. 86%, but cxr on presentation unremarkable, patient does not have fever, no leukocytosis, no abx from admission - 1/28Suspect aspiration pneumonitis given recent nausea and vomiting. Patient does has h/o bronchiectasis, ? Flare up of bronchiectasis?, start chest vest, flutter valve, mucinex, nebs, currently no significant wheezing on exam. -1/29 better, less cough, lung exam has improved, continue currently regimen, continue chest PT.  -1/30 continue to feel better, cough less, Ambulating, want to go home,  -1/31- planned discharge cancelled, patient Developed fever last night possible aspirated after coughing spells, started on imipenem 1/30 night, abx changed to levaquin according to sputum culture, to finish another 3 days as an outpatient. - Patient will be discharged on 2 L nasal cannula at all time, baseline is 2 L at bedtime, wean gradually back to baseline.  Nausea and vomiting - Seems to be  self-limited. Zofran as needed.  -suspect coughing spells caused n/v.  Hypokalemia, hypomagnesemia: Repleted, continue to replace, d/c chlorthalidone.   Myasthenia gravis ([place]) - Continue Mestinon. Able to ambulate, has fair cough effort.  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 baseline hoarseness. - Continue tube feeds. Nothing by mouth.    Hypothyroidism - Acquired secondary to history of radiation therapy. Continue Synthroid.   Essential hypertension - Continue Cozaar and hydralazine. D/c chlorthalidone due to prone to dehydartion -not well controlled, continue titrate meds  Hyponatremia - Hold chlorthalidone on discharge     Discharge Condition:  stable   Follow UP  Follow-up Information    Follow up with [facility], EDGARDO, MD. Schedule an appointment as soon as possible for a visit in 1 week.   Specialty:  Internal Medicine   Why:  Posthospitalization follow-up   Contact information:   [id number] [place] [address] [phone]         Discharge Instructions  and  Discharge Medications     Discharge Instructions    Discharge instructions    Complete by:  As directed   Follow with Primary MD Thomes Dinning, MD in 7 days   Get CBC, CMP, 2 view Chest X ray checked  by Primary MD next visit.    Activity: As tolerated with Full fall precautions use walker/cane & assistance as needed   Disposition Home    Diet: Patient is strict nothing by mouth, on tube feeds only  For Heart failure patients - Check your Weight same time everyday, if you gain over 2 pounds, or you develop in leg swelling, experience more shortness of breath or chest pain, call your Primary MD immediately. Follow Cardiac Low Salt Diet and 1.5 lit/day fluid restriction.   On your next visit with your primary care physician please Get Medicines reviewed and  adjusted.   Please request your Prim.MD to go over all Hospital Tests and Procedure/Radiological results at the follow up, please get all Hospital records sent to your Prim MD by signing hospital release before you go home.   If you experience worsening of your admission symptoms, develop shortness of breath, life threatening emergency, suicidal or homicidal thoughts you must seek medical attention immediately by calling 911 or calling your MD immediately  if symptoms less severe.  You Must read complete instructions/literature along with all the possible adverse reactions/side effects for all the Medicines you take and that have been prescribed to you. Take any new Medicines after you have completely understood and  accpet all the possible adverse reactions/side effects.   Do not drive, operating heavy machinery, perform activities at heights, swimming or participation in water activities or provide baby sitting services if your were admitted for syncope or siezures until you have seen by Primary MD or a Neurologist and advised to do so again.  Do not drive when taking Pain medications.    Do not take more than prescribed Pain, Sleep and Anxiety Medications  Special Instructions: If you have smoked or chewed Tobacco  in the last 2 yrs please stop smoking, stop any regular Alcohol  and or any Recreational drug use.  Wear Seat belts while driving.   Please note  You were cared for by a hospitalist during your hospital stay. If you have any questions about your discharge medications or the care you received while you were in the hospital after you are discharged, you can call the unit and asked to speak with the hospitalist on call if the hospitalist that took care of you is not available. Once you are discharged, your primary care physician will handle any further medical issues. Please note that NO REFILLS for any discharge medications will be authorized once you are discharged, as it is  imperative that you return to your primary care physician (or establish a relationship with a primary care physician if you do not have one) for your aftercare needs so that they can reassess your need for medications and monitor your lab values.     Increase activity slowly    Complete by:  As directed             Medication List    STOP taking these medications        chlorthalidone 25 MG tablet  Commonly known as:  HYGROTON      TAKE these medications        ANTIHISTAMINE PO  Take 25 mg by mouth at bedtime.     BACTROBAN NASAL NA  Place into the nose as needed (generic).     BENEFIBER PO  Take 10 mLs by mouth 2 (two) times daily.     calcium carbonate 500 MG chewable tablet  Commonly known as:  TUMS - dosed in mg elemental calcium  Chew 2 tablets by mouth daily.     DOXAZOSIN MESYLATE PO  Take 2 mg by mouth at bedtime.     Echinacea 400 MG Caps  Take 1 capsule by mouth 2 (two) times daily.     FLUORIDE MOUTH RINSE MT  Use as directed in the mouth or throat.     guaifenesin 100 MG/5ML syrup  Commonly known as:  ROBITUSSIN  Take 200 mg by mouth 3 (three) times daily as needed for cough.     guaiFENesin 600 MG 12 hr tablet  Commonly known as:  MUCINEX  Take 1 tablet (600 mg total) by mouth 2 (two) times daily.     hydrALAZINE 25 MG tablet  Commonly known as:  APRESOLINE  Take 25 mg by mouth 3 (three) times daily. 7am, 1pm, 7pm     ISOSOURCE 1.5 CAL Liqd  Take 250 mLs by mouth 5 (five) times daily. 250 ml @ 7am, 1000 am, 1300 pm, 1600 pm and 125 ml @ 1900 pm.     levofloxacin 750 MG tablet  Commonly known as:  LEVAQUIN  Take 1 tablet (750 mg total) by mouth daily.  Start taking on:  10/16/2015     levothyroxine 75 MCG tablet  Commonly known  as:  SYNTHROID, LEVOTHROID  Take 75 mcg by mouth daily before breakfast.     loperamide 2 MG capsule  Commonly known as:  IMODIUM  Take by mouth as needed for diarrhea or loose stools.     losartan 100 MG tablet   Commonly known as:  COZAAR  Take 100 mg by mouth daily.     Melatonin 5 MG/15ML Liqd  Take 5 mg by mouth at bedtime.     NONFORMULARY OR COMPOUNDED ITEM  Take 5 mLs by mouth 2 (two) times daily. Honey, local (for allergies)     OXYGEN  Inhale 2 L into the lungs.     PROBIOTIC DAILY PO  Take 620 mg by mouth 2 (two) times daily.     rOPINIRole 0.5 MG tablet  Commonly known as:  REQUIP  Take 0.5 mg by mouth at bedtime.     TYLENOL PO  Take 30 mLs by mouth at bedtime.     Vitamin D 400 UNIT/ML Liqd  Take 1,000 Units by mouth 1 day or 1 dose.          Diet and Activity recommendation: See Discharge Instructions above   Consults obtained - none   Major procedures and Radiology Reports - PLEASE review detailed and final reports for all details, in brief -      Dg Chest 2 View  10/15/2015  CLINICAL DATA:  Productive cough, shortness of breath. EXAM: CHEST  2 VIEW COMPARISON:  10/13/2015 FINDINGS: The cardiac silhouette is enlarged. Mediastinal contours appear intact. There is no evidence of pneumothorax. There are bilateral pleural effusions. There is improved appearance of interstitial pulmonary edema. Osseous structures are without acute abnormality. Soft tissues are grossly normal. IMPRESSION: Enlarged cardiac silhouette. Somewhat improved interstitial pulmonary edema with small to moderate bilateral pleural effusions. Electronically Signed   By: Fidela Salisbury M.D.   On: 10/15/2015 15:32   Dg Chest Port 1 View  10/14/2015  CLINICAL DATA:  Increased oxygen demand. EXAM: PORTABLE CHEST 1 VIEW COMPARISON:  Radiograph 10/10/2015, 10/02/2015. Most recent chest CT 03/20/2015 FINDINGS: Cardiomegaly is unchanged. Progressive interstitial prominence from prior exam, with vascular congestion. Right infrahilar opacities suggestive of atelectasis. No large pleural effusion. No pneumothorax. Unchanged right proximal humerus lesion, likely enchondroma or bone infarct. IMPRESSION: 1.  Development of vascular congestion with progressive interstitial prominence from prior, may reflect mild edema or increased interstitial lung disease. Cardiomegaly is unchanged. 2. Right infrahilar opacity, suggestive of atelectasis. Electronically Signed   By: Jeb Levering M.D.   On: 10/14/2015 00:33   Dg Chest Port 1 View  10/10/2015  CLINICAL DATA:  Shortness of breath.  Vomiting. EXAM: PORTABLE CHEST 1 VIEW COMPARISON:  10/02/2015 and 06/13/2015 and CT scan of the chest dated 03/20/2015 FINDINGS: Chronic slight cardiomegaly. Pulmonary vascularity is normal. No infiltrates or effusions. Chronic accentuation of the interstitial markings. No acute osseous abnormality. Sclerotic lesion in the proximal humerus, unchanged since 08/29/2013 and probably representing a bone infarct or benign chondroid lesion. IMPRESSION: No acute abnormality. Chronic cardiomegaly and chronic interstitial lung disease. Electronically Signed   By: Lorriane Shire M.D.   On: 10/10/2015 09:36    Micro Results     Recent Results (from the past 240 hour(s))  Urine culture     Status: None   Collection Time: 10/10/15 11:20 AM  Result Value Ref Range Status   Specimen Description URINE, CLEAN CATCH  Final   Special Requests NONE  Final   Culture   Final  MULTIPLE SPECIES PRESENT, SUGGEST RECOLLECTION Performed at [facility]    Report Status 10/11/2015 FINAL  Final  Culture, respiratory (NON-Expectorated)     Status: None   Collection Time: 10/11/15  4:40 PM  Result Value Ref Range Status   Specimen Description SPUTUM  Final   Special Requests NONE  Final   Gram Stain   Final    ABUNDANT WBC PRESENT,BOTH PMN AND MONONUCLEAR FEW SQUAMOUS EPITHELIAL CELLS PRESENT ABUNDANT GRAM POSITIVE COCCI IN CHAINS FEW GRAM POSITIVE RODS FEW GRAM NEGATIVE RODS Performed at [facility]    Culture   Final    MODERATE ENTEROBACTER AEROGENES ABUNDANT GROUP B STREP(S.AGALACTIAE)ISOLATED Note: Beta hemolytic  streptococci are predictably susceptible to penicillin and other beta lactams. Susceptibility testing not routinely performed. Performed at [facility]    Report Status 10/14/2015 FINAL  Final   Organism ID, Bacteria ENTEROBACTER AEROGENES  Final      Susceptibility   Enterobacter aerogenes - MIC*    CEFAZOLIN >=64 RESISTANT Resistant     CEFEPIME <=1 SENSITIVE Sensitive     CEFTAZIDIME <=1 SENSITIVE Sensitive     CEFTRIAXONE <=1 SENSITIVE Sensitive     CIPROFLOXACIN <=0.25 SENSITIVE Sensitive     GENTAMICIN <=1 SENSITIVE Sensitive     IMIPENEM 2 INTERMEDIATE Intermediate     PIP/TAZO <=4 SENSITIVE Sensitive     TOBRAMYCIN <=1 SENSITIVE Sensitive     TRIMETH/SULFA Value in next row Sensitive      <=20 SENSITIVE(NOTE)    * MODERATE ENTEROBACTER AEROGENES  Culture, expectorated sputum-assessment     Status: None   Collection Time: 10/11/15  4:41 PM  Result Value Ref Range Status   Specimen Description SPUTUM  Final   Special Requests NONE  Final   Sputum evaluation   Final    THIS SPECIMEN IS ACCEPTABLE. RESPIRATORY CULTURE REPORT TO FOLLOW.   Report Status 10/11/2015 FINAL  Final  MRSA PCR Screening     Status: None   Collection Time: 10/11/15  6:22 PM  Result Value Ref Range Status   MRSA by PCR NEGATIVE NEGATIVE Final    Comment:        The GeneXpert MRSA Assay (FDA approved for NASAL specimens only), is one component of a comprehensive MRSA colonization surveillance program. It is not intended to diagnose MRSA infection nor to guide or monitor treatment for MRSA infections.   Culture, blood (routine x 2)     Status: None (Preliminary result)   Collection Time: 10/14/15  4:10 AM  Result Value Ref Range Status   Specimen Description BLOOD RIGHT ANTECUBITAL  Final   Special Requests BOTTLES DRAWN AEROBIC AND ANAEROBIC 5CC  Final   Culture   Final    NO GROWTH 1 DAY Performed at [facility]    Report Status PENDING  Incomplete  Culture, blood  (routine x 2)     Status: None (Preliminary result)   Collection Time: 10/14/15  4:15 AM  Result Value Ref Range Status   Specimen Description BLOOD RIGHT HAND  Final   Special Requests BOTTLES DRAWN AEROBIC AND ANAEROBIC 5CC  Final   Culture   Final    NO GROWTH 1 DAY Performed at [facility]    Report Status PENDING  Incomplete       Today   Subjective:   Chelsee Dragone today has no headache,no chest abdominal pain,no new weakness tingling or numbness, feels much better wants to go home today.   Objective:   Blood  pressure 157/96, pulse 109, temperature 97.8 F (36.6 C), temperature source Oral, resp. rate 18, height 5\' 5"  (1.651 m), weight 58.3 kg (128 lb 8.5 oz), SpO2 96 %.   Intake/Output Summary (Last 24 hours) at 10/15/15 1605 Last data filed at 10/15/15 0645  Gross per 24 hour  Intake   2114 ml  Output      0 ml  Net   2114 ml    Exam  General: NAD,   Cardiovascular: RRR  Respiratory: Good air entry bilaterally, no wheezing, no use of accessory muscles   Abdomen: Soft/ND/NT, positive BS, peg tube in place  Musculoskeletal: No Edema  Neuro: aaox3  Data Review   CBC w Diff: Lab Results  Component Value Date   WBC 4.2 10/15/2015   WBC 6.1 03/31/2013   HGB 11.7* 10/15/2015   HGB 12.6 03/31/2013   HCT 34.7* 10/15/2015   HCT 36.5 03/31/2013   PLT 134* 10/15/2015   PLT 171 03/31/2013   LYMPHOPCT 4 10/10/2015   LYMPHOPCT 10.7 03/31/2013   MONOPCT 7 10/10/2015   MONOPCT 7.7 03/31/2013   EOSPCT 0 10/10/2015   EOSPCT 0.0 03/31/2013   BASOPCT 0 10/10/2015   BASOPCT 0.6 03/31/2013    CMP: Lab Results  Component Value Date   NA 132* 10/15/2015   NA 138 04/02/2013   K 3.3* 10/15/2015   K 3.2* 04/02/2013   CL 94* 10/15/2015   CL 107 04/02/2013   CO2 29 10/15/2015   CO2 30 04/02/2013   BUN 28* 10/15/2015   BUN 24* 04/02/2013   CREATININE 0.66 10/15/2015   CREATININE 0.76 04/02/2013   PROT 7.5 10/10/2015   PROT 5.7* 04/01/2013    ALBUMIN 4.5 10/10/2015   ALBUMIN 3.0* 04/01/2013   BILITOT 1.1 10/10/2015   BILITOT 0.6 04/01/2013   ALKPHOS 71 10/10/2015   ALKPHOS 87 04/01/2013   AST 27 10/10/2015   AST 24 04/01/2013   ALT 15 10/10/2015   ALT 33 04/01/2013  .   Total Time in preparing paper work, data evaluation and todays exam - 35 minutes  Legrande Hao M.D on 10/15/2015 at [facility]   Office  [phone]

## 2015-10-15 NOTE — Discharge Instructions (Signed)
Follow with Primary MD Thomes Dinning, MD in 7 days   Get CBC, CMP, 2 view Chest X ray checked  by Primary MD next visit.    Activity: As tolerated with Full fall precautions use walker/cane & assistance as needed   Disposition Home    Diet: Patient is strict nothing by mouth, on tube feeds only  For Heart failure patients - Check your Weight same time everyday, if you gain over 2 pounds, or you develop in leg swelling, experience more shortness of breath or chest pain, call your Primary MD immediately. Follow Cardiac Low Salt Diet and 1.5 lit/day fluid restriction.   On your next visit with your primary care physician please Get Medicines reviewed and adjusted.   Please request your Prim.MD to go over all Hospital Tests and Procedure/Radiological results at the follow up, please get all Hospital records sent to your Prim MD by signing hospital release before you go home.   If you experience worsening of your admission symptoms, develop shortness of breath, life threatening emergency, suicidal or homicidal thoughts you must seek medical attention immediately by calling 911 or calling your MD immediately  if symptoms less severe.  You Must read complete instructions/literature along with all the possible adverse reactions/side effects for all the Medicines you take and that have been prescribed to you. Take any new Medicines after you have completely understood and accpet all the possible adverse reactions/side effects.   Do not drive, operating heavy machinery, perform activities at heights, swimming or participation in water activities or provide baby sitting services if your were admitted for syncope or siezures until you have seen by Primary MD or a Neurologist and advised to do so again.  Do not drive when taking Pain medications.    Do not take more than prescribed Pain, Sleep and Anxiety Medications  Special Instructions: If you have smoked or chewed Tobacco  in the last 2  yrs please stop smoking, stop any regular Alcohol  and or any Recreational drug use.  Wear Seat belts while driving.   Please note  You were cared for by a hospitalist during your hospital stay. If you have any questions about your discharge medications or the care you received while you were in the hospital after you are discharged, you can call the unit and asked to speak with the hospitalist on call if the hospitalist that took care of you is not available. Once you are discharged, your primary care physician will handle any further medical issues. Please note that NO REFILLS for any discharge medications will be authorized once you are discharged, as it is imperative that you return to your primary care physician (or establish a relationship with a primary care physician if you do not have one) for your aftercare needs so that they can reassess your need for medications and monitor your lab values.

## 2015-10-15 NOTE — Plan of Care (Signed)
Problem: Nutrition: Goal: Adequate nutrition will be maintained Outcome: Progressing Patient did tube feeding tonight. Had refused during the day.

## 2015-10-15 NOTE — Progress Notes (Signed)
Pt does qualify for 02 per 02 desat screen. MD to place home 02 orders and [facility] DME rep made aware of 02 need. Marney Doctor RN,BSN,NCM [phone]

## 2015-10-15 NOTE — Progress Notes (Signed)
Initial Nutrition Assessment  DOCUMENTATION CODES:   Non-severe (moderate) malnutrition in context of chronic illness  INTERVENTION:   Continue current TF regimen: IsoSource 1.5 4.5 cans daily. Cans provided from home. Free water flushes of 180 ml with every bolus, total: 810 ml H20.  This provides 1688 kcal, 77g protein and 1670 ml of H2O.  RD to continue to monitor for needs  NUTRITION DIAGNOSIS:   Malnutrition related to chronic illness as evidenced by moderate depletion of body fat, severe depletion of muscle mass.  GOAL:   Patient will meet greater than or equal to 90% of their needs  MONITOR:   Labs, Weight trends, TF tolerance, I & O's  REASON FOR ASSESSMENT:   Other (Comment) (History of PEG)    ASSESSMENT:   70 y.o. female with a PMH of myasthenia gravis, COPD, bronchiectasis, tonsillar cancer s/p XRT and PEG/tube with history of aspiration pneumonia who presented to [facility] with a chief complaint of a three-day history of reflux of tube feedings followed by severe vomiting, chest pain and shortness of breath. Over the past 24 hours, she was able to tolerate small feedings, water and her medications, but has not resumed her usual schedule of tube feedings. She is concerned that she may have recurrent aspiration pneumonia due to the congestion in her chest. Endorses some shortness of breath, but has chronic shortness of breath due to her COPD. She has a cough productive of green mucous.  Pt in room with no family present. Pt with PEG and mickey button. Pt reports home TF regimen of IsoSource 1.5, 4.5 cans daily. Pt states the she would usually flush 4 oz (120 ml) of water and 2 oz (60 ml) of ginger ale after each bolus. This provides 1688 kcal, 77g of protein and 1670 ml H20. Pt was previously spitting up d/t coughing spells. She reports she was aspirating. Pt is NPO. Per weight history and pt's reported UBW, weight is stable around 125-128 lb.  Nutrition-Focused  physical exam completed. Findings are mild-moderate fat depletion, severe muscle depletion, and no edema.  Labs reviewed: Low Na, K Elevated BUN Mg/Phos WNL  Diet Order:  Diet NPO time specified  Skin:  Reviewed, no issues  Last BM:  1/31  Height:   Ht Readings from Last 1 Encounters:  10/10/15 5\' 5"  (1.651 m)    Weight:   Wt Readings from Last 1 Encounters:  10/10/15 128 lb 8.5 oz (58.3 kg)    Ideal Body Weight:  56.8 kg  BMI:  Body mass index is 21.39 kg/(m^2).  Estimated Nutritional Needs:   Kcal:  1500-1700  Protein:  70-80g  Fluid:  1.8L/day  EDUCATION NEEDS:   No education needs identified at this time  Clayton Bibles, MS, RD, LDN Pager: [phone] After Hours Pager: [phone]

## 2015-10-19 LAB — CULTURE, BLOOD (ROUTINE X 2)
CULTURE: NO GROWTH
Culture: NO GROWTH

## 2017-03-06 ENCOUNTER — Encounter (HOSPITAL_BASED_OUTPATIENT_CLINIC_OR_DEPARTMENT_OTHER): Payer: Self-pay | Admitting: Emergency Medicine

## 2017-03-06 ENCOUNTER — Emergency Department (HOSPITAL_BASED_OUTPATIENT_CLINIC_OR_DEPARTMENT_OTHER)
Admission: EM | Admit: 2017-03-06 | Discharge: 2017-03-06 | Disposition: A | Discharge status: Home / Self Care | Payer: Medicare Other | Attending: Emergency Medicine | Admitting: Emergency Medicine

## 2017-03-06 DIAGNOSIS — Z87891 Personal history of nicotine dependence: Secondary | ICD-10-CM | POA: Insufficient documentation

## 2017-03-06 DIAGNOSIS — Z79899 Other long term (current) drug therapy: Secondary | ICD-10-CM | POA: Diagnosis not present

## 2017-03-06 DIAGNOSIS — K9423 Gastrostomy malfunction: Secondary | ICD-10-CM | POA: Diagnosis present

## 2017-03-06 DIAGNOSIS — T85598A Other mechanical complication of other gastrointestinal prosthetic devices, implants and grafts, initial encounter: Secondary | ICD-10-CM

## 2017-03-06 DIAGNOSIS — E039 Hypothyroidism, unspecified: Secondary | ICD-10-CM | POA: Insufficient documentation

## 2017-03-06 DIAGNOSIS — J449 Chronic obstructive pulmonary disease, unspecified: Secondary | ICD-10-CM | POA: Insufficient documentation

## 2017-03-06 NOTE — ED Notes (Addendum)
Patient has a GJ tube that was placed last Monday.  The J port is clogged.  The G tube flushes well.  She only uses the tube from 4pm to 9 am.  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

## 2017-03-06 NOTE — ED Notes (Signed)
Pt switched to our wall O2.

## 2017-03-06 NOTE — ED Provider Notes (Signed)
[facility] DEPT Provider Note   CSN: [id number] Arrival date & time: 03/06/17  1721  By signing my name below, I, Collene Leyden, attest that this documentation has been prepared under the direction and in the presence of Julianne Rice, MD. Electronically Signed: Collene Leyden, Scribe. 03/06/17. 7:43 PM.  History   Chief Complaint Chief Complaint  Patient presents with  . Clogged feeding tube   HPI Comments: Maria Vincent is a 71 y.o. female with a history of COPD and tonsillar cancer, who presents to the Emergency Department complaining that the [facility] of her feeding tube is clogged. Noticed the problem  at 4 pm today. Patient states her feeding tube has been clogged up for several hours. Patient reports attempting to de-clog the feeding tube with both hot water and coke. Patient reports having the tube inserted 6 days ago at [facility]. No additional symptoms noted. Patient denies any fever, chills, nausea, vomiting, or diarrhea.   The history is provided by the patient. No language interpreter was used.    Past Medical History:  Diagnosis Date  . Bronchiectasis ([place])    Dr. Elta Guadeloupe Doner [phone]  . Bursitis of elbow   . COPD (chronic obstructive pulmonary disease) ([place])   . GI disease    100 % NPO: G-tube: Dr. Governor Specking [phone]  . High blood pressure    Dr. Austin Miles [phone] : Extensivist  . Hypothyroidism (acquired)   . Myasthenia gravis ([place]) 09/2007   Dr. Lauretta Chester [phone]  . Tonsillar cancer [facility])    Dr. Monica Becton [phone]    Patient Active Problem List   Diagnosis Date Noted  . Bronchiectasis with acute exacerbation ([place])   . Hypoxia 10/10/2015  . S/P percutaneous endoscopic gastrostomy (PEG) tube placement ([place]) 10/10/2015  . Nausea and vomiting 10/10/2015  . Chronic interstitial lung disease ([place]) 10/10/2015  . Myasthenia gravis ([place]) 10/10/2015  . Acute on chronic respiratory failure ([place])  10/10/2015  . Hypothyroidism 10/10/2015  . Essential hypertension 10/10/2015  . Tonsillar cancer [facility])     Past Surgical History:  Procedure Laterality Date  . ABDOMINAL HYSTERECTOMY  02/2006  . APPENDECTOMY  10/1960  . FOOT SURGERY    . GASTROSTOMY TUBE PLACEMENT  08/28/2007  . RECTOCELE REPAIR  08/2005    OB History    No data available       Home Medications    Prior to Admission medications   Medication Sig Start Date End Date Taking? Authorizing Provider  carvedilol (COREG) 12.5 MG tablet Take 12.5 mg by mouth 2 (two) times daily with a meal.   Yes [provider]  levothyroxine (SYNTHROID, LEVOTHROID) 75 MCG tablet Take 75 mcg by mouth daily before breakfast.   Yes [provider]  losartan (COZAAR) 100 MG tablet Take 100 mg by mouth daily.   Yes [provider]  Acetaminophen (TYLENOL PO) Take 30 mLs by mouth at bedtime.    [provider]  calcium carbonate (TUMS - DOSED IN MG ELEMENTAL CALCIUM) 500 MG chewable tablet Chew 2 tablets by mouth daily.    [provider]  Cholecalciferol (VITAMIN D) 400 UNIT/ML LIQD Take 1,000 Units by mouth 1 day or 1 dose.    [provider]  DOXAZOSIN MESYLATE PO Take 2 mg by mouth at bedtime.    [provider]  Echinacea 400 MG CAPS Take 1 capsule by mouth 2 (two) times daily.     [provider]  guaiFENesin (MUCINEX) 600 MG 12 hr tablet Take 1 tablet (600 mg total) by mouth 2 (two) times daily. 10/15/15   Elgergawy, Silver Huguenin, MD  guaifenesin (ROBITUSSIN) 100 MG/5ML syrup Take 200 mg by mouth 3 (three) times daily as needed for cough.    [provider]  hydrALAZINE (APRESOLINE) 25 MG tablet Take 25 mg by mouth 3 (three) times daily. 7am, 1pm, 7pm    [provider]  loperamide (IMODIUM) 2 MG capsule Take by mouth as needed for diarrhea or loose stools.    [provider]  Melatonin 5 MG/15ML LIQD Take 5 mg by mouth at bedtime.    [provider]  Mupirocin Calcium (BACTROBAN NASAL NA) Place into the nose as needed (generic).    [provider]  NONFORMULARY OR COMPOUNDED ITEM Take 5 mLs by mouth 2 (two) times daily. Honey, local (for allergies)    [provider]  Nutritional Supplements (ISOSOURCE 1.5 CAL) LIQD Take 250 mLs by mouth 5 (five) times daily. 250 ml @ 7am, 1000 am, 1300 pm, 1600 pm and 125 ml @ 1900 pm.    [provider]  OXYGEN Inhale 2 L into the lungs.    [provider]  Probiotic Product (PROBIOTIC DAILY PO) Take 620 mg by mouth 2 (two) times daily.    [provider]  rOPINIRole (REQUIP) 0.5 MG tablet Take 0.5 mg by mouth at bedtime.    [provider]  Sodium Fluoride (FLUORIDE MOUTH RINSE MT) Use as directed in the mouth or throat.    [provider]  Triprolidine-Pseudoephedrine (ANTIHISTAMINE PO) Take 25 mg by mouth at bedtime.     [provider]  Wheat Dextrin (BENEFIBER PO) Take 10 mLs by mouth 2 (two) times daily.    [provider]    Family History Family History  Problem Relation Age of Onset  . Hypertension Mother   . Stroke Mother   . Heart attack Father   . Stroke Maternal Grandfather   . Stroke Paternal Grandfather   . Hypertension Sister   . Hypertension Sister   . Diabetes Sister     Social History Social History  Substance Use Topics  . Smoking status: Former Smoker    Quit date: 09/13/1984  . Smokeless tobacco: Never Used  . Alcohol use No     Allergies   Cephalosporins; Phenergan [promethazine hcl]; Albuterol; Ambien [zolpidem tartrate]; Clonidine derivatives; Demerol [meperidine]; Hctz [hydrochlorothiazide]; Novocain [procaine]; Penicillins; Sulfur; Bacitracin; Biaxin [clarithromycin]; Merthiolate [thimerosal]; Neosporin [neomycin-bacitracin zn-polymyx]; and Polysporin [bacitracin-polymyxin b]   Review of Systems Review of Systems  Constitutional: Negative for chills and fever.    Respiratory: Negative for cough and shortness of breath.   Cardiovascular: Negative for chest pain.  Gastrointestinal: Negative for abdominal pain, diarrhea and nausea.  Musculoskeletal: Negative for back pain, myalgias and neck pain.  Skin: Negative for rash and wound.  Neurological: Negative for dizziness, weakness, light-headedness and headaches.  All other systems reviewed and are negative.    Physical Exam Updated Vital Signs BP (!) 156/90 (BP Location: Left Arm)   Pulse 88   Temp 98.7 F (37.1 C) (Oral)   Resp (!) 24   SpO2 91%   Physical Exam  Constitutional: She is oriented to person, place, and time. She appears well-developed and well-nourished. No distress.  HENT:  Head: Normocephalic and atraumatic.  Mouth/Throat: Oropharynx is clear and moist.  Eyes: EOM are normal. Pupils are equal, round, and reactive to light.  Neck: Normal  range of motion. Neck supple.  Cardiovascular: Normal rate and regular rhythm.   Pulmonary/Chest: Effort normal and breath sounds normal.  Abdominal: Soft. Bowel sounds are normal. There is no tenderness. There is no rebound and no guarding.  G-tube in place with G and J port. No surrounding erythema or warmth. Abdomen is soft and nontender.  Musculoskeletal: Normal range of motion. She exhibits no edema or tenderness.  Neurological: She is alert and oriented to person, place, and time.  Skin: Skin is warm and dry. No rash noted. No erythema.  Psychiatric: She has a normal mood and affect. Her behavior is normal.  Nursing note and vitals reviewed.    ED Treatments / Results  DIAGNOSTIC STUDIES: Oxygen Saturation is 90% on RA, low by my interpretation.    COORDINATION OF CARE: 7:43 PM Discussed treatment plan with pt at bedside and pt agreed to plan.  Labs (all labs ordered are listed, but only abnormal results are displayed) Labs Reviewed - No data to display  EKG  EKG Interpretation None       Radiology No results  found.  Procedures Procedures (including critical care time)  Medications Ordered in ED Medications - No data to display   Initial Impression / Assessment and Plan / ED Course  I have reviewed the triage vital signs and the nursing notes.  Pertinent labs & imaging results that were available during my care of the patient were reviewed by me and considered in my medical decision making (see chart for details).    Unable to flush the J port. Attempted declotting maneuvers with Coke. Patient is able to use the G port of her tube. Advised small boluses while sitting upright and well before lying down to sleep. She is to follow-up with her doctor tomorrow for possible exchange. Family agrees with plan. Return precautions given.  Final Clinical Impressions(s) / ED Diagnoses   Final diagnoses:  Clogged feeding tube    New Prescriptions Discharge Medication List as of 03/06/2017  9:40 PM     I personally performed the services described in this documentation, which was scribed in my presence. The recorded information has been reviewed and is accurate.       Julianne Rice, MD 03/08/17 [phone]

## 2017-03-06 NOTE — ED Triage Notes (Signed)
Pt feeding tube is clogged. Family has tried normal tricks without success. Tube was changed 1 week ago

## 2017-03-18 ENCOUNTER — Inpatient Hospital Stay
Admission: AD | Admit: 2017-03-18 | Discharge: 2017-04-13 | Disposition: E | Payer: Self-pay | Source: Ambulatory Visit | Attending: Internal Medicine | Admitting: Internal Medicine

## 2017-03-18 ENCOUNTER — Other Ambulatory Visit (HOSPITAL_COMMUNITY): Payer: Self-pay

## 2017-03-18 DIAGNOSIS — Z931 Gastrostomy status: Secondary | ICD-10-CM

## 2017-03-18 DIAGNOSIS — J9600 Acute respiratory failure, unspecified whether with hypoxia or hypercapnia: Secondary | ICD-10-CM

## 2017-03-18 LAB — VANCOMYCIN, TROUGH: Vancomycin Tr: 16 ug/mL (ref 15–20)

## 2017-03-18 MED ORDER — IOPAMIDOL (ISOVUE-300) INJECTION 61%
INTRAVENOUS | Status: AC
  Administered 2017-03-18: 50 mL via JEJUNOSTOMY
  Filled 2017-03-18: qty 50

## 2017-03-19 ENCOUNTER — Other Ambulatory Visit (HOSPITAL_COMMUNITY): Payer: Self-pay

## 2017-03-19 LAB — CBC WITH DIFFERENTIAL/PLATELET
Basophils Absolute: 0 10*3/uL (ref 0.0–0.1)
Basophils Relative: 0 %
Eosinophils Absolute: 0 10*3/uL (ref 0.0–0.7)
Eosinophils Relative: 0 %
HCT: 39.5 % (ref 36.0–46.0)
Hemoglobin: 11.9 g/dL — ABNORMAL LOW (ref 12.0–15.0)
LYMPHS ABS: 0.3 10*3/uL — AB (ref 0.7–4.0)
LYMPHS PCT: 2 %
MCH: 31 pg (ref 26.0–34.0)
MCHC: 30.1 g/dL (ref 30.0–36.0)
MCV: 102.9 fL — AB (ref 78.0–100.0)
MONOS PCT: 5 %
Monocytes Absolute: 0.5 10*3/uL (ref 0.1–1.0)
Neutro Abs: 10 10*3/uL — ABNORMAL HIGH (ref 1.7–7.7)
Neutrophils Relative %: 93 %
PLATELETS: 356 10*3/uL (ref 150–400)
RBC: 3.84 MIL/uL — AB (ref 3.87–5.11)
RDW: 16.3 % — ABNORMAL HIGH (ref 11.5–15.5)
WBC: 10.8 10*3/uL — ABNORMAL HIGH (ref 4.0–10.5)

## 2017-03-19 LAB — BLOOD GAS, ARTERIAL
ACID-BASE EXCESS: 16.9 mmol/L — AB (ref 0.0–2.0)
Bicarbonate: 43.6 mmol/L — ABNORMAL HIGH (ref 20.0–28.0)
DRAWN BY: 313061
FIO2: 55
O2 SAT: 95.7 %
PATIENT TEMPERATURE: 98.6
PCO2 ART: 86 mmHg — AB (ref 32.0–48.0)
RATE: 26 resp/min
pH, Arterial: 7.325 — ABNORMAL LOW (ref 7.350–7.450)
pO2, Arterial: 85.3 mmHg (ref 83.0–108.0)

## 2017-03-19 LAB — COMPREHENSIVE METABOLIC PANEL
ALBUMIN: 2.7 g/dL — AB (ref 3.5–5.0)
ALT: 15 U/L (ref 14–54)
AST: 17 U/L (ref 15–41)
Alkaline Phosphatase: 68 U/L (ref 38–126)
Anion gap: 6 (ref 5–15)
BUN: 29 mg/dL — ABNORMAL HIGH (ref 6–20)
CHLORIDE: 96 mmol/L — AB (ref 101–111)
CO2: 42 mmol/L — AB (ref 22–32)
CREATININE: 0.4 mg/dL — AB (ref 0.44–1.00)
Calcium: 8.3 mg/dL — ABNORMAL LOW (ref 8.9–10.3)
GFR calc Af Amer: >60 mL/min (ref >60)
GFR calc non Af Amer: >60 mL/min (ref >60)
GLUCOSE: 136 mg/dL — AB (ref 65–99)
POTASSIUM: 3.5 mmol/L (ref 3.5–5.1)
SODIUM: 144 mmol/L (ref 135–145)
Total Bilirubin: 0.4 mg/dL (ref 0.3–1.2)
Total Protein: 5.8 g/dL — ABNORMAL LOW (ref 6.5–8.1)

## 2017-03-19 LAB — URINALYSIS, ROUTINE W REFLEX MICROSCOPIC
Bacteria, UA: NONE SEEN
Bilirubin Urine: NEGATIVE
Glucose, UA: 50 mg/dL — AB
HGB URINE DIPSTICK: NEGATIVE
Ketones, ur: 5 mg/dL — AB
Leukocytes, UA: NEGATIVE
Nitrite: NEGATIVE
Protein, ur: 100 mg/dL — AB
SPECIFIC GRAVITY, URINE: 1.02 (ref 1.005–1.030)
pH: 6 (ref 5.0–8.0)

## 2017-03-19 LAB — MAGNESIUM: MAGNESIUM: 2 mg/dL (ref 1.7–2.4)

## 2017-03-20 LAB — URINE CULTURE: Culture: NO GROWTH

## 2017-03-21 LAB — BASIC METABOLIC PANEL
BUN: 21 mg/dL — AB (ref 6–20)
CHLORIDE: 92 mmol/L — AB (ref 101–111)
CO2: >50 mmol/L — ABNORMAL HIGH (ref 22–32)
Calcium: 7.7 mg/dL — ABNORMAL LOW (ref 8.9–10.3)
Creatinine, Ser: 0.32 mg/dL — ABNORMAL LOW (ref 0.44–1.00)
GFR calc Af Amer: >60 mL/min (ref >60)
Glucose, Bld: 100 mg/dL — ABNORMAL HIGH (ref 65–99)
POTASSIUM: 2.9 mmol/L — AB (ref 3.5–5.1)
SODIUM: 145 mmol/L (ref 135–145)

## 2017-03-21 LAB — VANCOMYCIN, TROUGH: VANCOMYCIN TR: 12 ug/mL — AB (ref 15–20)

## 2017-03-22 LAB — PHOSPHORUS: PHOSPHORUS: 1.1 mg/dL — AB (ref 2.5–4.6)

## 2017-03-22 LAB — CBC
HEMATOCRIT: 38.6 % (ref 36.0–46.0)
HEMOGLOBIN: 11.3 g/dL — AB (ref 12.0–15.0)
MCH: 30.9 pg (ref 26.0–34.0)
MCHC: 29.3 g/dL — ABNORMAL LOW (ref 30.0–36.0)
MCV: 105.5 fL — ABNORMAL HIGH (ref 78.0–100.0)
Platelets: 296 10*3/uL (ref 150–400)
RBC: 3.66 MIL/uL — ABNORMAL LOW (ref 3.87–5.11)
RDW: 15.8 % — AB (ref 11.5–15.5)
WBC: 8.5 10*3/uL (ref 4.0–10.5)

## 2017-03-22 LAB — BASIC METABOLIC PANEL
BUN: 28 mg/dL — ABNORMAL HIGH (ref 6–20)
CALCIUM: 7.7 mg/dL — AB (ref 8.9–10.3)
Chloride: 92 mmol/L — ABNORMAL LOW (ref 101–111)
Creatinine, Ser: 0.42 mg/dL — ABNORMAL LOW (ref 0.44–1.00)
Glucose, Bld: 151 mg/dL — ABNORMAL HIGH (ref 65–99)
Potassium: 3.2 mmol/L — ABNORMAL LOW (ref 3.5–5.1)
SODIUM: 145 mmol/L (ref 135–145)

## 2017-03-22 LAB — MAGNESIUM: MAGNESIUM: 2.2 mg/dL (ref 1.7–2.4)

## 2017-04-13 DEATH — deceased

## 2017-11-03 IMAGING — DX DG ABD PORTABLE 1V
1 series · 1 of 1 positions shown · non-contrast
Comparison: March 14, 2017 and March 11, 2017

CLINICAL DATA: Gastrostomy catheter placement

EXAM:
PORTABLE ABDOMEN - 1 VIEW

[abdomen kub]
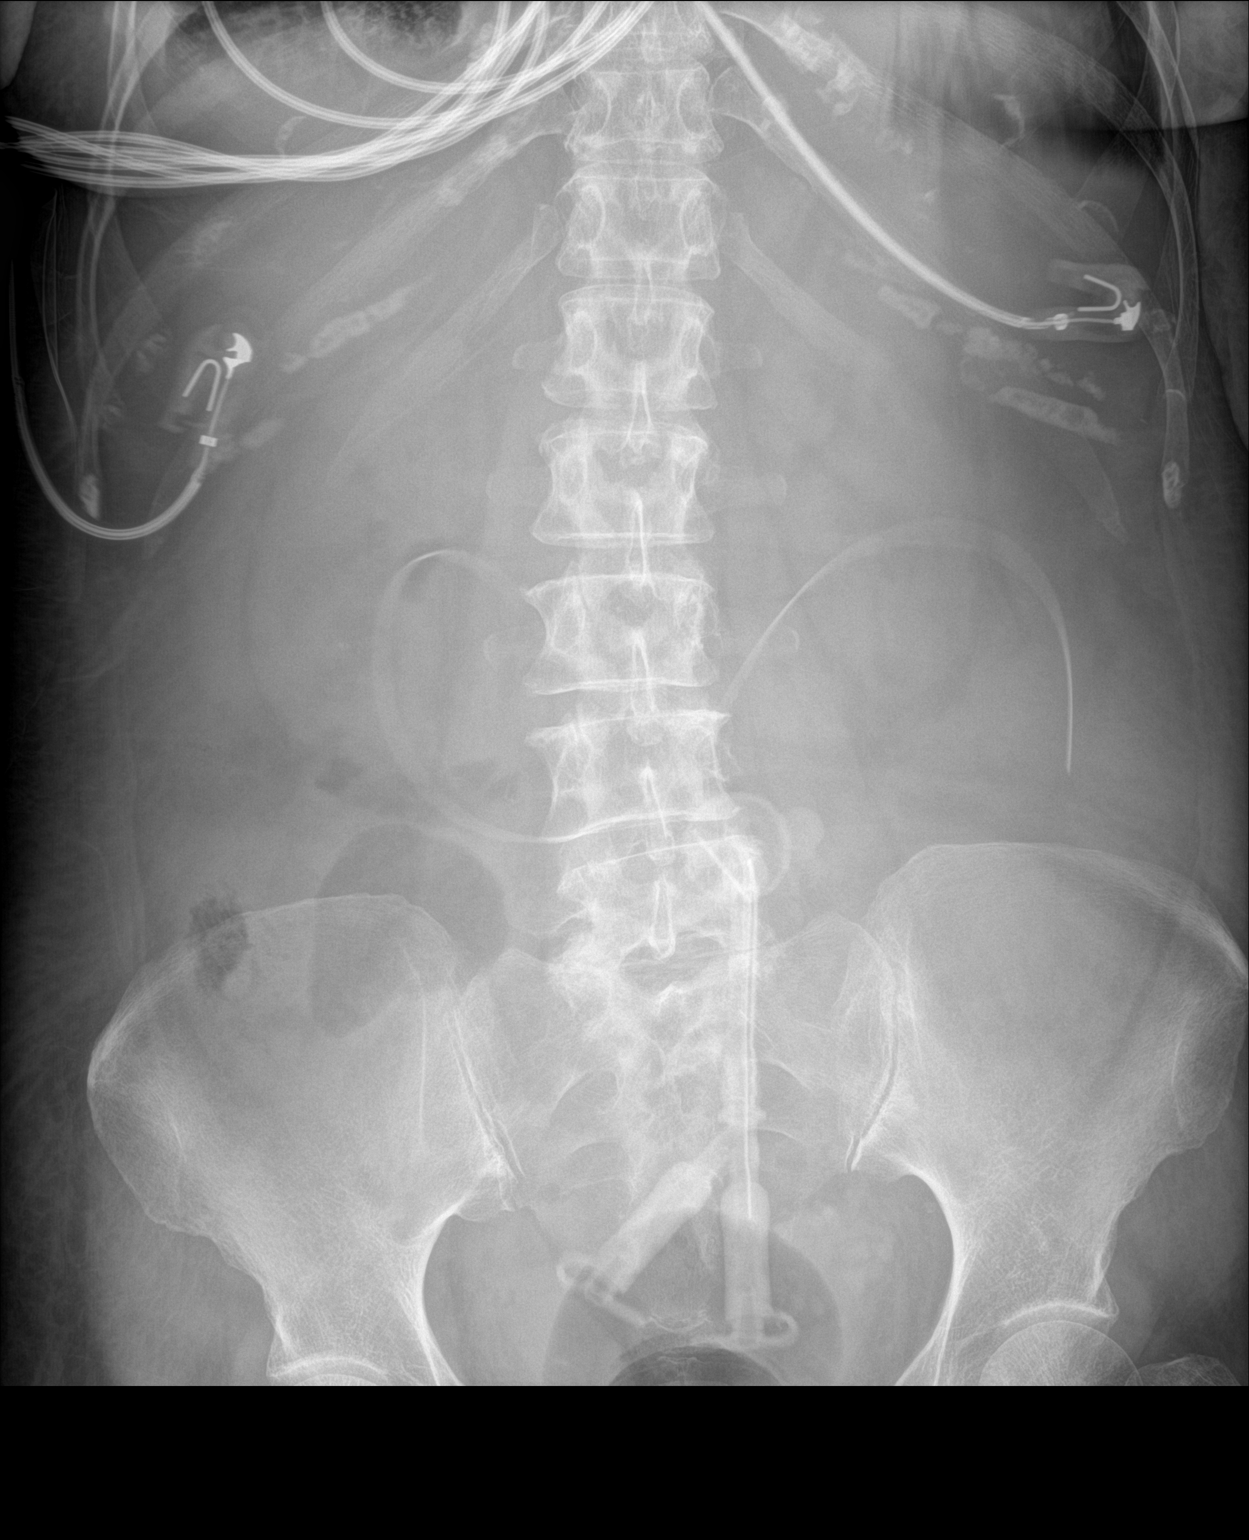

[1 of 1 positions shown; findings below may reference images not displayed]

FINDINGS: Gastrostomy catheter is positioned in the medial left lower abdomen.
There is no bowel dilatation or air-fluid level to suggest bowel
obstruction. No free air evident. Rectal thermometer in place.
IMPRESSION: The gastrostomy catheter in medial left lower abdomen. It is not
possible to confirm on this study that the catheter is within bowel
or gastric lumen. In this regard, contrast injection with imaging
may well be advisable. No bowel obstruction or free air evident.
